# Patient Record
Sex: Female | Born: 1975 | Race: White | Hispanic: Yes | Marital: Single | Smoking: Never smoker
Health system: Southern US, Community
[De-identification: ages and names within clinical notes are randomized; demographics above are authoritative.]

## PROBLEM LIST (undated history)

## (undated) DIAGNOSIS — F32A Depression, unspecified: Secondary | ICD-10-CM

## (undated) DIAGNOSIS — F329 Major depressive disorder, single episode, unspecified: Secondary | ICD-10-CM

## (undated) DIAGNOSIS — K37 Unspecified appendicitis: Secondary | ICD-10-CM

---

## 2004-02-17 ENCOUNTER — Inpatient Hospital Stay (HOSPITAL_COMMUNITY): Admission: AD | Admit: 2004-02-17 | Discharge: 2004-02-20 | Payer: Self-pay | Admitting: Obstetrics

## 2005-08-11 ENCOUNTER — Emergency Department (HOSPITAL_COMMUNITY): Admission: EM | Admit: 2005-08-11 | Discharge: 2005-08-11 | Payer: Self-pay | Admitting: Emergency Medicine

## 2005-08-20 ENCOUNTER — Ambulatory Visit: Payer: Self-pay | Admitting: Family Medicine

## 2005-09-04 ENCOUNTER — Ambulatory Visit: Payer: Self-pay | Admitting: *Deleted

## 2006-03-23 ENCOUNTER — Encounter (INDEPENDENT_AMBULATORY_CARE_PROVIDER_SITE_OTHER): Payer: Self-pay | Admitting: *Deleted

## 2006-03-23 ENCOUNTER — Ambulatory Visit: Payer: Self-pay | Admitting: Family Medicine

## 2006-06-01 ENCOUNTER — Emergency Department (HOSPITAL_COMMUNITY): Admission: EM | Admit: 2006-06-01 | Discharge: 2006-06-01 | Payer: Self-pay | Admitting: Family Medicine

## 2006-10-13 ENCOUNTER — Ambulatory Visit: Payer: Self-pay | Admitting: Family Medicine

## 2006-10-13 ENCOUNTER — Encounter (INDEPENDENT_AMBULATORY_CARE_PROVIDER_SITE_OTHER): Payer: Self-pay | Admitting: Family Medicine

## 2006-11-11 ENCOUNTER — Emergency Department (HOSPITAL_COMMUNITY): Admission: EM | Admit: 2006-11-11 | Discharge: 2006-11-11 | Payer: Self-pay | Admitting: Family Medicine

## 2006-11-12 ENCOUNTER — Ambulatory Visit (HOSPITAL_COMMUNITY): Admission: RE | Admit: 2006-11-12 | Discharge: 2006-11-12 | Payer: Self-pay | Admitting: Emergency Medicine

## 2006-12-22 ENCOUNTER — Emergency Department (HOSPITAL_COMMUNITY): Admission: EM | Admit: 2006-12-22 | Discharge: 2006-12-22 | Payer: Self-pay | Admitting: Family Medicine

## 2008-01-26 ENCOUNTER — Ambulatory Visit: Payer: Self-pay | Admitting: Internal Medicine

## 2008-05-20 ENCOUNTER — Emergency Department (HOSPITAL_COMMUNITY): Admission: EM | Admit: 2008-05-20 | Discharge: 2008-05-20 | Payer: Self-pay | Admitting: Family Medicine

## 2008-12-17 ENCOUNTER — Ambulatory Visit: Payer: Self-pay | Admitting: Internal Medicine

## 2008-12-17 ENCOUNTER — Encounter (INDEPENDENT_AMBULATORY_CARE_PROVIDER_SITE_OTHER): Payer: Self-pay | Admitting: Family Medicine

## 2008-12-17 LAB — CONVERTED CEMR LAB
Basophils Absolute: 0 10*3/uL (ref 0.0–0.1)
Lymphocytes Relative: 37 % (ref 12–46)
Neutro Abs: 3.5 10*3/uL (ref 1.7–7.7)
Neutrophils Relative %: 53 % (ref 43–77)
Platelets: 309 10*3/uL (ref 150–400)
RDW: 12.5 % (ref 11.5–15.5)
TSH: 2.062 microintl units/mL (ref 0.350–4.500)

## 2009-01-15 ENCOUNTER — Emergency Department (HOSPITAL_COMMUNITY): Admission: EM | Admit: 2009-01-15 | Discharge: 2009-01-15 | Payer: Self-pay | Admitting: Family Medicine

## 2009-09-13 ENCOUNTER — Ambulatory Visit (HOSPITAL_COMMUNITY): Admission: RE | Admit: 2009-09-13 | Discharge: 2009-09-13 | Payer: Self-pay | Admitting: Obstetrics & Gynecology

## 2010-01-16 ENCOUNTER — Encounter: Payer: Self-pay | Admitting: Obstetrics

## 2010-01-16 ENCOUNTER — Inpatient Hospital Stay (HOSPITAL_COMMUNITY): Admission: AD | Admit: 2010-01-16 | Discharge: 2010-01-19 | Payer: Self-pay | Admitting: Obstetrics

## 2010-10-09 ENCOUNTER — Encounter: Payer: Self-pay | Admitting: Obstetrics & Gynecology

## 2010-10-09 ENCOUNTER — Ambulatory Visit: Admit: 2010-10-09 | Payer: Self-pay | Admitting: Obstetrics & Gynecology

## 2010-10-09 ENCOUNTER — Encounter: Payer: Self-pay | Admitting: Physician Assistant

## 2010-10-09 ENCOUNTER — Other Ambulatory Visit: Payer: Self-pay | Admitting: Obstetrics & Gynecology

## 2010-10-09 DIAGNOSIS — N949 Unspecified condition associated with female genital organs and menstrual cycle: Secondary | ICD-10-CM

## 2010-10-09 LAB — CONVERTED CEMR LAB: GC Probe Amp, Genital: NEGATIVE

## 2010-10-09 LAB — GLUCOSE, CAPILLARY: Glucose-Capillary: 90 mg/dL (ref 70–99)

## 2010-10-13 ENCOUNTER — Other Ambulatory Visit: Payer: Self-pay

## 2010-10-13 ENCOUNTER — Encounter (INDEPENDENT_AMBULATORY_CARE_PROVIDER_SITE_OTHER): Payer: Self-pay | Admitting: *Deleted

## 2010-10-13 DIAGNOSIS — Z0189 Encounter for other specified special examinations: Secondary | ICD-10-CM

## 2010-10-27 ENCOUNTER — Ambulatory Visit: Payer: Self-pay | Admitting: Physician Assistant

## 2010-10-27 DIAGNOSIS — N949 Unspecified condition associated with female genital organs and menstrual cycle: Secondary | ICD-10-CM

## 2010-11-25 LAB — CBC
HCT: 28.5 % — ABNORMAL LOW (ref 36.0–46.0)
Hemoglobin: 10.2 g/dL — ABNORMAL LOW (ref 12.0–15.0)
Hemoglobin: 12.8 g/dL (ref 12.0–15.0)
MCHC: 35.3 g/dL (ref 30.0–36.0)
MCV: 92.1 fL (ref 78.0–100.0)
MCV: 92.8 fL (ref 78.0–100.0)
Platelets: 185 10*3/uL (ref 150–400)
RBC: 3.92 MIL/uL (ref 3.87–5.11)
WBC: 11.9 10*3/uL — ABNORMAL HIGH (ref 4.0–10.5)

## 2010-11-28 NOTE — Progress Notes (Signed)
Pamela Travis, Pamela Travis    ACCOUNT NO.:  [id number]  MEDICAL RECORD NO.:  [id number]           PATIENT TYPE:  A  LOCATION:  [facility]                   FACILITY:  [facility]  PHYSICIAN:  Maylon Cos, CNM    DATE OF BIRTH:  25-Feb-1976  DATE OF SERVICE:  10/09/2010                                 CLINIC NOTE  The patient presents to [facility].  REASON FOR TODAY'S VISIT:  The patient was referred from [facility] with a diagnosis of amenorrhea.  HISTORY OF PRESENT ILLNESS:  The patient is 35 years old who states that gravida 2 para 2-0-0-2.  Her last child was born in May 2011 by cesarean section, and she had a ParaGard IUD placed for contraception at her 6- week postpartum visit.  The patient states that she has not had a period on her own since the birth of her child and her bleeding stopped after childbirth.  She was given a trial of Provera 5 mg a day for 5 days per Dr. Jaynie Collins at the end of December.  The patient does report that she had 2 periods in January, the first one on September 12, 2010, lasting 4 days and the second one being on September 26, 2010, also lasting 4 days.  She describes these periods are heavy in nature requiring her to change a regular pad 10 times a day.  She denies clotting with these.  MEDICAL HISTORY:  The patient has no known drug allergies.  Vaccines are up-to-date.  MENSTRUAL HISTORY:  She was 15 at menarche.  She has had irregular periods as described in HPI.  CONTRACEPTIVE HISTORY:  She has a ParaGard IUD.  OBSTETRICAL HISTORY:  She has been pregnant twice has 2 living children and has had 1 cesarean section.  Her last Pap smear was March 03, 2010 at [facility] surgery.  SURGICAL HISTORY:  Negative other than her cesarean section.  PHYSICAL EXAMINATION:  GENERAL:  On examination today, the patient is a pleasant Hispanic female who appears to be her stated age.  She is in no apparent distress. VITAL SIGNS:   Vital signs are stable.  Blood pressure is 105/67.  Her weight today is 154.8. HEENT:  Grossly normal with no thyromegaly. ABDOMEN:  Soft and nontender. PELVIC:  External genitalia without lesions.  Mucous membranes are pink, irregular rugae.  She has a scant amount of yellow creamy discharge without odor.  Cervix is parous and nonfriable.  Bimanual exam reveals nonenlarged, nontender uterus with nonenlarged, nontender adnexa. EXTREMITIES:  Equal strength and range of motion without signs of edema.  ASSESSMENT:  Dysfunctional uterine bleeding.  PLAN:  Discussed today with the patient that we will assess lab work today to look for possible reasons for her dysfunctional uterine bleeding with ParaGard IUD.  TSH, prolactin, and fasting 17 alpha- hydroxyprogesterone along with range of blood sugar were drawn.  The patient was given the option of starting oral contraceptive pills to regulate her cycles because she obviously can have a withdrawal bleed. She declines this at this time.  The patient should follow up in 2 weeks for the results of her lab work or p.r.n. with problems.  ______________________________ Maylon Cos, CNM    SS/MEDQ  D:  10/09/2010  T:  10/10/2010  Job:  [id number]

## 2010-11-28 NOTE — Progress Notes (Signed)
Pamela Travis, Pamela Travis    ACCOUNT NO.:  [id number]  MEDICAL RECORD NO.:  [id number]           PATIENT TYPE:  A  [facility]ATION[facility]                   FACILITY:  [facility]  PHYSICIAN:  Maylon Cos, CNM    DATE OF BIRTH:  10/16/1975  DATE OF SERVICE:  10/27/2010                                 CLINIC NOTE  The patient is being seen in the GYN Clinic at [facility].  REASON FOR TODAY VISIT:  Results of lab work.  HISTORY OF PRESENT ILLNESS:  The patient is a 35 year old gravida 2, para 2-0-0-2 who has a ParaGard IUD for contraception for approximately 8 months.  She presented originally at the beginning of this month on October 09, 2010 and saw myself concerning her complaint of not having had a period since the IUD had been placed.  She was given a trial of Provera at the end of December 2011, and then had 2 periods in January 2012. At the time of her visit, she had a normal exam and lab work was drawn to assess for other causes of her amenorrhea.  Options were explained to her at that time that if she is desiring a monthly periods and lab work is normal that it may be prudent for [place] to pull her ParaGard IUD and start her on oral contraceptive pills or some other daily dosing.  The following labs were explained to the patient through her interpreter.  Her alpha hydroxyprogesterone was normal at 66. Gonorrhea and Chlamydia were both negative.  TSH is normal at 1.265 and prolactin level was normal at 9.  These results were shared with the patient and reassurance was given concerning no seeming underlying pituitary or thyroid dysfunction that is causing her amenorrhea. Options again were explained to her for continue placement of IUD versus removing IUD and starting on oral contraceptive pills.  The patient states that she does desire to continue with a ParaGard in place and that she does not desire oral contraceptive pills and she is okay with not having a period as long  as lab work and exam were within normal limits.  I agree with this opinion.  ASSESSMENT:  Dysfunctional uterine bleeding, likely related to ParaGard intrauterine device.  PLAN:  The patient will keep ParaGard IUD in place.  She should follow up with [place] or with the [facility] Department for continued GYN care or with problems.          ______________________________ Maylon Cos, CNM    SS/MEDQ  D:  10/27/2010  T:  10/28/2010  Job:  [id number]

## 2010-12-16 LAB — POCT I-STAT, CHEM 8
Calcium, Ion: 1.2 mmol/L (ref 1.12–1.32)
Chloride: 102 mEq/L (ref 96–112)
Creatinine, Ser: 0.7 mg/dL (ref 0.4–1.2)
Glucose, Bld: 115 mg/dL — ABNORMAL HIGH (ref 70–99)
HCT: 39 % (ref 36.0–46.0)

## 2011-01-27 ENCOUNTER — Inpatient Hospital Stay (INDEPENDENT_AMBULATORY_CARE_PROVIDER_SITE_OTHER)
Admission: RE | Admit: 2011-01-27 | Discharge: 2011-01-27 | Disposition: A | Discharge status: Home / Self Care | Payer: Self-pay | Source: Ambulatory Visit | Attending: Emergency Medicine | Admitting: Emergency Medicine

## 2011-01-27 DIAGNOSIS — J069 Acute upper respiratory infection, unspecified: Secondary | ICD-10-CM

## 2011-06-10 LAB — POCT URINALYSIS DIP (DEVICE)
Bilirubin Urine: NEGATIVE
Hgb urine dipstick: NEGATIVE
Ketones, ur: NEGATIVE
Nitrite: NEGATIVE
Specific Gravity, Urine: 1.015
pH: 7

## 2012-12-18 ENCOUNTER — Emergency Department (INDEPENDENT_AMBULATORY_CARE_PROVIDER_SITE_OTHER)
Admission: EM | Admit: 2012-12-18 | Discharge: 2012-12-18 | Disposition: A | Discharge status: Home / Self Care | Payer: Self-pay | Source: Home / Self Care | Attending: Emergency Medicine | Admitting: Emergency Medicine

## 2012-12-18 ENCOUNTER — Encounter (HOSPITAL_COMMUNITY): Payer: Self-pay | Admitting: Emergency Medicine

## 2012-12-18 DIAGNOSIS — G43909 Migraine, unspecified, not intractable, without status migrainosus: Secondary | ICD-10-CM

## 2012-12-18 MED ORDER — ONDANSETRON HCL 4 MG PO TABS: 4.0000 mg | ORAL_TABLET | Freq: Three times a day (TID) | ORAL | Status: DC | PRN

## 2012-12-18 MED ORDER — ONDANSETRON 4 MG PO TBDP
4.0000 mg | ORAL_TABLET | Freq: Once | ORAL | Status: AC
  Administered 2012-12-18: 4 mg via ORAL

## 2012-12-18 MED ORDER — ACETAMINOPHEN-CODEINE #3 300-30 MG PO TABS: 1.0000 | ORAL_TABLET | Freq: Four times a day (QID) | ORAL | Status: DC | PRN

## 2012-12-18 MED ORDER — KETOROLAC TROMETHAMINE 60 MG/2ML IM SOLN
60.0000 mg | Freq: Once | INTRAMUSCULAR | Status: AC
  Administered 2012-12-18: 60 mg via INTRAMUSCULAR

## 2012-12-18 MED ORDER — ONDANSETRON 4 MG PO TBDP
ORAL_TABLET | ORAL | Status: AC
  Filled 2012-12-18: qty 1

## 2012-12-18 MED ORDER — ONDANSETRON HCL 4 MG PO TABS: 4.0000 mg | ORAL_TABLET | Freq: Four times a day (QID) | ORAL | Status: DC

## 2012-12-18 MED ORDER — KETOROLAC TROMETHAMINE 60 MG/2ML IM SOLN
INTRAMUSCULAR | Status: AC
  Filled 2012-12-18: qty 2

## 2012-12-18 NOTE — ED Notes (Signed)
Pt c/o migraine headache. Hx of migraines. Pt states that she has been under a lot stress lately.  Some nausea and vomiting. Denies blurred or spotty vision. Symptoms gradually getting worse.  Pt has taken ibuprofen with mild relief.

## 2012-12-18 NOTE — ED Provider Notes (Signed)
History     CSN: [id number]  Arrival date & time 12/18/12  1310   First MD Initiated Contact with Patient 12/18/12 1327      Chief Complaint  Patient presents with  . Migraine    hx of migraines. pt has had some n/v. pt states under alot of stress lately    (Consider location/radiation/quality/duration/timing/severity/associated sxs/prior treatment) HPI Comments: Headache,,,,as her usual migraines  been about 7 month since my last episode...started about 6 days ago...and and ist not getting better, today started with nausea and vomited x 1. No paresthesias, No weakness. No visual changes. Patient feels she's been under a lot of stress lately. Denies any further symptoms such as fevers, visual changes oral neurological symptoms such as paresthesias or weakness. She has attempted with over-the-counter Tylenol but it's not helping.  Patient is a 37 y.o. female presenting with migraines. The history is provided by the patient.  Migraine This is a recurrent problem. The current episode started more than 2 days ago. The problem occurs constantly. The problem has not changed since onset.Associated symptoms include headaches. Pertinent negatives include no chest pain, no abdominal pain and no shortness of breath. The symptoms are aggravated by stress and walking. Nothing relieves the symptoms. The treatment provided no relief.    History reviewed. No pertinent past medical history.  Past Surgical History  Procedure Laterality Date  . Cesarean section      History reviewed. No pertinent family history.  History  Substance Use Topics  . Smoking status: Never Smoker   . Smokeless tobacco: Not on file  . Alcohol Use: No    OB History   Grav Para Term Preterm Abortions TAB SAB Ect Mult Living                  Review of Systems  Constitutional: Positive for activity change. Negative for fever, appetite change and fatigue.  HENT: Negative for ear pain, nosebleeds, facial swelling,  neck pain, neck stiffness, postnasal drip and tinnitus.   Eyes: Positive for photophobia.  Respiratory: Negative for shortness of breath.   Cardiovascular: Negative for chest pain.  Gastrointestinal: Negative for abdominal pain and blood in stool.  Musculoskeletal: Negative for back pain, joint swelling, arthralgias and gait problem.  Skin: Negative for color change and pallor.  Neurological: Positive for headaches.    Allergies  Review of patient's allergies indicates no known allergies.  Home Medications   Current Outpatient Rx  Name  Route  Sig  Dispense  Refill  . FLUoxetine (PROZAC) 10 MG tablet   Oral   Take 10 mg by mouth daily.         Marland Kitchen acetaminophen-codeine (TYLENOL #3) 300-30 MG per tablet   Oral   Take 1-2 tablets by mouth every 6 (six) hours as needed for pain.   15 tablet   0   . acetaminophen-codeine (TYLENOL #3) 300-30 MG per tablet   Oral   Take 1-2 tablets by mouth every 6 (six) hours as needed for pain.   20 tablet   0   . ondansetron (ZOFRAN) 4 MG tablet   Oral   Take 1 tablet (4 mg total) by mouth every 8 (eight) hours as needed for nausea.   20 tablet   0   . ondansetron (ZOFRAN) 4 MG tablet   Oral   Take 1 tablet (4 mg total) by mouth every 6 (six) hours.   12 tablet   0     BP 117/70  Pulse  66  Temp(Src) 98 F (36.7 C) (Oral)  Resp 18  SpO2 99%  LMP 12/11/2012  Physical Exam  Nursing note and vitals reviewed. Constitutional: She is oriented to person, place, and time. She appears distressed.  Eyes: Pupils are equal, round, and reactive to light.  Neck: Normal range of motion. Neck supple. No JVD present.  Abdominal: Normal appearance. There is no tenderness.  Neurological: She is alert and oriented to person, place, and time. She has normal strength. She displays no tremor. No cranial nerve deficit or sensory deficit. She exhibits normal muscle tone. Coordination normal.  Skin: No rash noted. No erythema.    ED Course   Procedures (including critical care time)  Labs Reviewed - No data to display No results found.   1. Migraine headache       MDM  Migraine headache. Patient was responsive to a intramuscular to oral dull dose along with Zofran feeling much improved has been given a prescription of Tylenol #3 along with Zofran. She has been instructed about what symptoms should warrant further evaluation or her return. Patient agrees with current treatment plan followup care as necessary.       Jimmie Molly, MD 12/18/12 [phone]

## 2014-11-21 ENCOUNTER — Emergency Department (HOSPITAL_COMMUNITY)
Admission: EM | Admit: 2014-11-21 | Discharge: 2014-11-21 | Disposition: A | Discharge status: Other Acute Inpatient Hospital | Payer: Self-pay

## 2014-11-21 ENCOUNTER — Ambulatory Visit (HOSPITAL_COMMUNITY)
Admission: RE | Admit: 2014-11-21 | Discharge: 2014-11-21 | Disposition: A | Discharge status: Home / Self Care | Payer: Medicaid Other | Source: Ambulatory Visit | Attending: Family Medicine | Admitting: Family Medicine

## 2014-11-21 ENCOUNTER — Observation Stay (HOSPITAL_COMMUNITY)
Admission: EM | Admit: 2014-11-21 | Discharge: 2014-11-22 | Disposition: A | Discharge status: Home / Self Care | Payer: Medicaid Other | Attending: Surgery | Admitting: Surgery

## 2014-11-21 ENCOUNTER — Encounter (HOSPITAL_COMMUNITY): Payer: Self-pay

## 2014-11-21 ENCOUNTER — Ambulatory Visit (INDEPENDENT_AMBULATORY_CARE_PROVIDER_SITE_OTHER): Payer: Self-pay | Admitting: Family Medicine

## 2014-11-21 VITALS — BP 106/68 | HR 79 | Temp 98.2°F | Resp 18 | Ht 65.0 in | Wt 175.0 lb

## 2014-11-21 DIAGNOSIS — K3580 Unspecified acute appendicitis: Secondary | ICD-10-CM

## 2014-11-21 DIAGNOSIS — Z79899 Other long term (current) drug therapy: Secondary | ICD-10-CM | POA: Diagnosis not present

## 2014-11-21 DIAGNOSIS — R109 Unspecified abdominal pain: Secondary | ICD-10-CM

## 2014-11-21 DIAGNOSIS — K37 Unspecified appendicitis: Secondary | ICD-10-CM | POA: Diagnosis present

## 2014-11-21 DIAGNOSIS — R197 Diarrhea, unspecified: Secondary | ICD-10-CM | POA: Insufficient documentation

## 2014-11-21 HISTORY — DX: Depression, unspecified: F32.A

## 2014-11-21 HISTORY — DX: Unspecified appendicitis: K37

## 2014-11-21 HISTORY — DX: Major depressive disorder, single episode, unspecified: F32.9

## 2014-11-21 LAB — POCT UA - MICROSCOPIC ONLY
Casts, Ur, LPF, POC: NEGATIVE
Crystals, Ur, HPF, POC: NEGATIVE
Epithelial cells, urine per micros: NEGATIVE
Mucus, UA: NEGATIVE
WBC, Ur, HPF, POC: NEGATIVE
Yeast, UA: NEGATIVE

## 2014-11-21 LAB — CBC WITH DIFFERENTIAL/PLATELET
BASOS ABS: 0 10*3/uL (ref 0.0–0.1)
BASOS PCT: 0 % (ref 0–1)
EOS PCT: 2 % (ref 0–5)
Eosinophils Absolute: 0.2 10*3/uL (ref 0.0–0.7)
HCT: 32.8 % — ABNORMAL LOW (ref 36.0–46.0)
Hemoglobin: 11.4 g/dL — ABNORMAL LOW (ref 12.0–15.0)
LYMPHS ABS: 2.7 10*3/uL (ref 0.7–4.0)
Lymphocytes Relative: 26 % (ref 12–46)
MCH: 29.8 pg (ref 26.0–34.0)
MCHC: 34.8 g/dL (ref 30.0–36.0)
MCV: 85.6 fL (ref 78.0–100.0)
Monocytes Absolute: 0.7 10*3/uL (ref 0.1–1.0)
Monocytes Relative: 7 % (ref 3–12)
NEUTROS PCT: 65 % (ref 43–77)
Neutro Abs: 6.6 10*3/uL (ref 1.7–7.7)
PLATELETS: 311 10*3/uL (ref 150–400)
RBC: 3.83 MIL/uL — AB (ref 3.87–5.11)
RDW: 12.6 % (ref 11.5–15.5)
WBC: 10.1 10*3/uL (ref 4.0–10.5)

## 2014-11-21 LAB — POCT URINALYSIS DIPSTICK
BILIRUBIN UA: NEGATIVE
GLUCOSE UA: NEGATIVE
KETONES UA: NEGATIVE
Leukocytes, UA: NEGATIVE
NITRITE UA: NEGATIVE
PROTEIN UA: NEGATIVE
Spec Grav, UA: 1.01
Urobilinogen, UA: 0.2
pH, UA: 6.5

## 2014-11-21 LAB — POCT CBC
Granulocyte percent: 77.1 %G (ref 37–80)
HEMATOCRIT: 37.5 % — AB (ref 37.7–47.9)
Hemoglobin: 12 g/dL — AB (ref 12.2–16.2)
LYMPH, POC: 2.5 (ref 0.6–3.4)
MCH: 28.3 pg (ref 27–31.2)
MCHC: 32 g/dL (ref 31.8–35.4)
MCV: 88.4 fL (ref 80–97)
MID (CBC): 0.6 (ref 0–0.9)
MPV: 6.2 fL (ref 0–99.8)
PLATELET COUNT, POC: 385 10*3/uL (ref 142–424)
POC Granulocyte: 10.3 — AB (ref 2–6.9)
POC LYMPH %: 18.5 % (ref 10–50)
POC MID %: 4.4 %M (ref 0–12)
RBC: 4.24 M/uL (ref 4.04–5.48)
RDW, POC: 12.7 %
WBC: 13.4 10*3/uL — AB (ref 4.6–10.2)

## 2014-11-21 LAB — POCT URINE PREGNANCY: Preg Test, Ur: NEGATIVE

## 2014-11-21 MED ORDER — IOHEXOL 300 MG/ML  SOLN
100.0000 mL | Freq: Once | INTRAMUSCULAR | Status: AC | PRN
  Administered 2014-11-21: 100 mL via INTRAVENOUS

## 2014-11-21 MED ORDER — ONDANSETRON HCL 4 MG/2ML IJ SOLN
4.0000 mg | Freq: Once | INTRAMUSCULAR | Status: AC
  Administered 2014-11-21: 4 mg via INTRAVENOUS
  Filled 2014-11-21: qty 2

## 2014-11-21 MED ORDER — SODIUM CHLORIDE 0.9 % IV BOLUS (SEPSIS)
250.0000 mL | Freq: Once | INTRAVENOUS | Status: AC
  Administered 2014-11-21: 250 mL via INTRAVENOUS

## 2014-11-21 MED ORDER — HYDROMORPHONE HCL 1 MG/ML IJ SOLN
0.5000 mg | Freq: Once | INTRAMUSCULAR | Status: AC
  Administered 2014-11-21: 0.5 mg via INTRAVENOUS
  Filled 2014-11-21: qty 1

## 2014-11-21 NOTE — ED Provider Notes (Signed)
CSN: [id number]     Arrival date & time 11/21/14  2216 History   First MD Initiated Contact with Patient 11/21/14 2219     Chief Complaint  Patient presents with  . Abdominal Pain     (Consider location/radiation/quality/duration/timing/severity/associated sxs/prior Treatment) Patient is a 39 y.o. female presenting with abdominal pain. The history is provided by the patient. No language interpreter was used.  Abdominal Pain Pain location:  RLQ Pain quality: aching   Pain radiates to:  Does not radiate Pain severity:  Moderate Duration:  2 days Timing:  Constant Progression:  Worsening Chronicity:  New Context: not alcohol use, not medication withdrawal, not previous surgeries and not sick contacts   Relieved by:  Nothing Worsened by:  Nothing tried Ineffective treatments:  None tried Associated symptoms: no anorexia and no vomiting   Risk factors: has not had multiple surgeries   Pt was seen at urgent care and sent here for outpatient ct scan.   Pt reports she last ate at 5p.   History reviewed. No pertinent past medical history. Past Surgical History  Procedure Laterality Date  . Cesarean section     No family history on file. History  Substance Use Topics  . Smoking status: Never Smoker   . Smokeless tobacco: Not on file  . Alcohol Use: No   OB History    No data available     Review of Systems  Gastrointestinal: Positive for abdominal pain. Negative for vomiting and anorexia.  All other systems reviewed and are negative.     Allergies  Review of patient's allergies indicates no known allergies.  Home Medications   Prior to Admission medications   Medication Sig Start Date End Date Taking? Authorizing Provider  FLUoxetine (PROZAC) 10 MG tablet Take 10 mg by mouth daily.    Historical Provider, MD  ibuprofen (ADVIL,MOTRIN) 800 MG tablet Take 800 mg by mouth every 8 (eight) hours as needed.    Historical Provider, MD   BP 114/69 mmHg  Pulse 67  Temp(Src)  98.5 F (36.9 C) (Oral)  Resp 16  Ht 5\' 5"  (1.651 m)  Wt 175 lb (79.379 kg)  BMI 29.12 kg/m2  SpO2 98%  LMP 11/18/2014 Physical Exam  Constitutional: She is oriented to person, place, and time. She appears well-developed and well-nourished.  HENT:  Head: Normocephalic.  Eyes: Conjunctivae and EOM are normal. Pupils are equal, round, and reactive to light.  Neck: Normal range of motion.  Pulmonary/Chest: Effort normal.  Abdominal: Soft. She exhibits no distension and no mass. There is tenderness. There is no rebound.  Musculoskeletal: Normal range of motion.  Neurological: She is alert and oriented to person, place, and time.  Skin: Skin is warm.  Psychiatric: She has a normal mood and affect.  Nursing note and vitals reviewed.   ED Course  Procedures (including critical care time) Labs Review Labs Reviewed - No data to display  Imaging Review Ct Abdomen Pelvis W Contrast  11/21/2014   CLINICAL DATA:  Right lower quadrant abdominal pain for 2 days. Diarrhea. Initial encounter.  EXAM: CT ABDOMEN AND PELVIS WITH CONTRAST  TECHNIQUE: Multidetector CT imaging of the abdomen and pelvis was performed using the standard protocol following bolus administration of intravenous contrast.  CONTRAST:  100mL OMNIPAQUE IOHEXOL 300 MG/ML  SOLN  COMPARISON:  Pelvic ultrasound performed 09/13/2009  FINDINGS: The visualized lung bases are clear.  The liver and spleen are unremarkable in appearance. The gallbladder is decompressed and within normal limits. The pancreas  and adrenal glands are unremarkable.  The kidneys are unremarkable in appearance. There is no evidence of hydronephrosis. No renal or ureteral stones are seen. No perinephric stranding is appreciated.  No free fluid is identified. The small bowel is unremarkable in appearance. The stomach is within normal limits. No acute vascular abnormalities are seen.  The appendix measures up to 1.1 cm in diameter, with mild wall thickening and  surrounding soft tissue inflammation, compatible with acute appendicitis. The appendix is predominantly inferior to the cecum. There is no evidence of perforation or abscess formation. Trace free fluid is seen. Mildly prominent pericecal nodes are noted.  Contrast progresses to the level of the ascending colon. The colon is unremarkable in appearance.  The bladder is mildly distended and grossly unremarkable. The uterus is within normal limits. The ovaries are relatively symmetric. No suspicious adnexal masses are seen. No inguinal lymphadenopathy is seen.  No acute osseous abnormalities are identified.  IMPRESSION: Acute appendicitis noted, with dilatation of the appendix to 1.1 cm in diameter, mild wall thickening and surrounding soft tissue inflammation. Trace free fluid noted. Mildly prominent pericecal nodes seen. No evidence of perforation or abscess formation at this time.  These results were relayed by telephone at the time of interpretation on 11/21/2014 at 9:59 pm to Dr. Abbe Amsterdam , who verbally acknowledged these results.   Electronically Signed   By: Roanna Raider M.D.   On: 11/21/2014 22:01     EKG Interpretation None      MDM  Pt brought to ED with Ct that shows appendicitis.  Labs ordered,  IV dilaudid .  and zofran for nausea.    Final diagnoses:  Appendicitis, unspecified appendicitis type    I spoke to Dr. Lindie Spruce.  Pt given rocephin flagyl iv.   Pt npo. Pt to be admitted    Elson Areas, PA-C 11/21/14 [address] [place], PA-C 11/22/14 1610  Mirian Mo, MD 11/27/14 [phone]

## 2014-11-21 NOTE — Progress Notes (Addendum)
[facility] [address], [place] [address] [phone]  Date:  11/21/2014   Name:  Pamela Travis   DOB:  Aug 26, 1976   MRN:  [id number]  PCP:  No PCP Per Patient    Chief Complaint: Flank Pain; Abdominal Pain; and Gastrophageal Reflux   History of Present Illness:  Pamela Travis is a 39 y.o. very pleasant female patient who presents with the following:  Here today with complaint of pain in her epigastric area and into her right lower quadrant.  She has noted this since yesterday am.  She thought this might be due to her menses, but it is worse than usual menstrual cramps and has not gone away.  Did not resolve with ibuprofen.   A few days ago she noted vaginal discharge which appeared green. This has cleared up and she endorses a history of vaginal discharge She feels nauseated when she presses on her belly She has not vomited. She does not have much appetite but did eat oatmeal and a salad this am  She is not having any urinary sx such as dysuria or urinary frequency.   She has her menses right now  She has had a C/s but no other operation on her belly.  She is generally in good health   There are no active problems to display for this patient.   History reviewed. No pertinent past medical history.  Past Surgical History  Procedure Laterality Date  . Cesarean section      History  Substance Use Topics  . Smoking status: Never Smoker   . Smokeless tobacco: Not on file  . Alcohol Use: No    History reviewed. No pertinent family history.  No Known Allergies  Medication list has been reviewed and updated.  Current Outpatient Prescriptions on File Prior to Visit  Medication Sig Dispense Refill  . acetaminophen-codeine (TYLENOL #3) 300-30 MG per tablet Take 1-2 tablets by mouth every 6 (six) hours as needed for pain. (Patient not taking: Reported on 11/21/2014) 15 tablet 0  . acetaminophen-codeine (TYLENOL #3) 300-30 MG  per tablet Take 1-2 tablets by mouth every 6 (six) hours as needed for pain. (Patient not taking: Reported on 11/21/2014) 20 tablet 0  . FLUoxetine (PROZAC) 10 MG tablet Take 10 mg by mouth daily.    . ondansetron (ZOFRAN) 4 MG tablet Take 1 tablet (4 mg total) by mouth every 8 (eight) hours as needed for nausea. (Patient not taking: Reported on 11/21/2014) 20 tablet 0  . ondansetron (ZOFRAN) 4 MG tablet Take 1 tablet (4 mg total) by mouth every 6 (six) hours. (Patient not taking: Reported on 11/21/2014) 12 tablet 0   No current facility-administered medications on file prior to visit.    Review of Systems:  As per HPI- otherwise negative.   Physical Examination: Filed Vitals:   11/21/14 1540  BP: 106/68  Pulse: 79  Temp: 98.2 F (36.8 C)  Resp: 18   Filed Vitals:   11/21/14 1540  Height:  (1.651 m)  Weight: 175 lb (79.379 kg)   Body mass index is 29.12 kg/(m^2). Ideal Body Weight: Weight in (lb) to have BMI = 25: 149.9  GEN: WDWN, NAD, Non-toxic, A & O x 3, overweight, looks well HEENT: Atraumatic, Normocephalic. Neck supple. No masses, No LAD.  Bilateral TM wnl, oropharynx normal.  PEERL,EOMI.   Ears and Nose: No external deformity. CV: RRR, No M/G/R. No JVD. No thrill. No extra heart sounds. PULM:  CTA B, no wheezes, crackles, rhonchi. No retractions. No resp. distress. No accessory muscle use. ABD: S,  ND, +BS. No HSM.  Tenderness over RLQ, rebound and guarding as well EXTR: No c/c/e NEURO Normal gait.  PSYCH: Normally interactive. Conversant. Not depressed or anxious appearing.  Calm demeanor.  Gu: she has menstural bleeding, no CMT, no adnexal tenderness or masses.  No vaginal discharge noted, no lesions  Pt is menstruating   Results for orders placed or performed in visit on 11/21/14  POCT urinalysis dipstick  Result Value Ref Range   Color, UA yellow    Clarity, UA clear    Glucose, UA neg    Bilirubin, UA neg    Ketones, UA neg    Spec Grav, UA 1.010     Blood, UA mod    pH, UA 6.5    Protein, UA neg    Urobilinogen, UA 0.2    Nitrite, UA neg    Leukocytes, UA Negative   POCT UA - Microscopic Only  Result Value Ref Range   WBC, Ur, HPF, POC neg    RBC, urine, microscopic 12-18    Bacteria, U Microscopic trace    Mucus, UA neg    Epithelial cells, urine per micros neg    Crystals, Ur, HPF, POC neg    Casts, Ur, LPF, POC neg    Yeast, UA neg   POCT CBC  Result Value Ref Range   WBC 13.4 (A) 4.6 - 10.2 K/uL   Lymph, poc 2.5 0.6 - 3.4   POC LYMPH PERCENT 18.5 10 - 50 %L   MID (cbc) 0.6 0 - 0.9   POC MID % 4.4 0 - 12 %M   POC Granulocyte 10.3 (A) 2 - 6.9   Granulocyte percent 77.1 37 - 80 %G   RBC 4.24 4.04 - 5.48 M/uL   Hemoglobin 12.0 (A) 12.2 - 16.2 g/dL   HCT, POC 16.137.5 (A) 09.637.7 - 47.9 %   MCV 88.4 80 - 97 fL   MCH, POC 28.3 27 - 31.2 pg   MCHC 32.0 31.8 - 35.4 g/dL   RDW, POC 04.512.7 %   Platelet Count, POC 385 142 - 424 K/uL   MPV 6.2 0 - 99.8 fL  POCT urine pregnancy  Result Value Ref Range   Preg Test, Ur Negative      Assessment and Plan: Right sided abdominal pain - Plan: POCT urinalysis dipstick, POCT UA - Microscopic Only, POCT CBC, POCT urine pregnancy, GC/Chlamydia Probe Amp, CT Abdomen Pelvis W Contrast  Concern for appendicitis. Referral to hospital for CT scan now.  She is upset but understands.  I will speak to her about her CT results asap   Signed Abbe AmsterdamJessica Copland, MD   IMPRESSION: Acute appendicitis noted, with dilatation of the appendix to 1.1 cm in diameter, mild wall thickening and surrounding soft tissue inflammation. Trace free fluid noted. Mildly prominent pericecal nodes seen. No evidence of perforation or abscess formation at this time.  These results were relayed by telephone at the time of interpretation on 11/21/2014 at 9:59 pm to Dr. Abbe AmsterdamJESSICA COPLAND , who verbally acknowledged these results.  [facility]  She does have appendicitis.  Asked tech to  escort her to the ER, called and alerted charge nurse and EDP.  Also spoke with pt on her cell phone, explained that she needs to go to the ER to be checked in and receive surgery for her appendicitis.  She states understanding

## 2014-11-21 NOTE — Patient Instructions (Signed)
[facility] department. You will drink some contrast and then have your CT scan. Stay there until we talk on the phone!

## 2014-11-21 NOTE — ED Notes (Signed)
Pt reports RLQ abdominal pain for two days. She [facility], had a CT scan and it showed an appendicitis and was instructed to come here to be seen to have appendectomy.

## 2014-11-22 ENCOUNTER — Inpatient Hospital Stay (HOSPITAL_COMMUNITY): Payer: Medicaid Other | Admitting: Anesthesiology

## 2014-11-22 ENCOUNTER — Encounter (HOSPITAL_COMMUNITY): Admission: EM | Disposition: A | Discharge status: Home / Self Care | Payer: Self-pay | Source: Home / Self Care

## 2014-11-22 ENCOUNTER — Encounter (HOSPITAL_COMMUNITY): Payer: Self-pay | Admitting: Anesthesiology

## 2014-11-22 DIAGNOSIS — K3580 Unspecified acute appendicitis: Secondary | ICD-10-CM | POA: Diagnosis not present

## 2014-11-22 DIAGNOSIS — Z79899 Other long term (current) drug therapy: Secondary | ICD-10-CM | POA: Diagnosis not present

## 2014-11-22 DIAGNOSIS — K37 Unspecified appendicitis: Secondary | ICD-10-CM | POA: Diagnosis present

## 2014-11-22 HISTORY — PX: LAPAROSCOPIC APPENDECTOMY: SHX408

## 2014-11-22 HISTORY — PX: APPENDECTOMY: SHX54

## 2014-11-22 LAB — COMPREHENSIVE METABOLIC PANEL
ALBUMIN: 3.3 g/dL — AB (ref 3.5–5.2)
ALT: 15 U/L (ref 0–35)
AST: 16 U/L (ref 0–37)
Alkaline Phosphatase: 89 U/L (ref 39–117)
Anion gap: 10 (ref 5–15)
BUN: 7 mg/dL (ref 6–23)
CO2: 25 mmol/L (ref 19–32)
Calcium: 8.6 mg/dL (ref 8.4–10.5)
Chloride: 102 mmol/L (ref 96–112)
Creatinine, Ser: 0.71 mg/dL (ref 0.50–1.10)
GFR calc Af Amer: >90 mL/min (ref >90)
GFR calc non Af Amer: >90 mL/min (ref >90)
GLUCOSE: 108 mg/dL — AB (ref 70–99)
Potassium: 3.5 mmol/L (ref 3.5–5.1)
Sodium: 137 mmol/L (ref 135–145)
Total Bilirubin: 0.3 mg/dL (ref 0.3–1.2)
Total Protein: 6.6 g/dL (ref 6.0–8.3)

## 2014-11-22 LAB — GC/CHLAMYDIA PROBE AMP
CT PROBE, AMP APTIMA: NEGATIVE
GC Probe RNA: NEGATIVE

## 2014-11-22 SURGERY — APPENDECTOMY, LAPAROSCOPIC
Anesthesia: General | Site: Abdomen

## 2014-11-22 MED ORDER — PROPOFOL 10 MG/ML IV BOLUS
INTRAVENOUS | Status: AC
  Filled 2014-11-22: qty 20

## 2014-11-22 MED ORDER — ONDANSETRON HCL 4 MG PO TABS: 4.0000 mg | ORAL_TABLET | Freq: Four times a day (QID) | ORAL | Status: DC | PRN

## 2014-11-22 MED ORDER — ONDANSETRON HCL 4 MG/2ML IJ SOLN
INTRAMUSCULAR | Status: DC | PRN
  Administered 2014-11-22: 4 mg via INTRAVENOUS

## 2014-11-22 MED ORDER — METRONIDAZOLE IN NACL 5-0.79 MG/ML-% IV SOLN: 500.0000 mg | Freq: Once | INTRAVENOUS | Status: DC

## 2014-11-22 MED ORDER — ENOXAPARIN SODIUM 40 MG/0.4ML ~~LOC~~ SOLN: 40.0000 mg | SUBCUTANEOUS | Status: DC

## 2014-11-22 MED ORDER — POTASSIUM CHLORIDE IN NACL 20-0.9 MEQ/L-% IV SOLN
INTRAVENOUS | Status: DC
  Administered 2014-11-22: 10:00:00 via INTRAVENOUS
  Filled 2014-11-22 (×2): qty 1000

## 2014-11-22 MED ORDER — GLYCOPYRROLATE 0.2 MG/ML IJ SOLN
INTRAMUSCULAR | Status: DC | PRN
  Administered 2014-11-22: 0.2 mg via INTRAVENOUS

## 2014-11-22 MED ORDER — HYDROMORPHONE HCL 1 MG/ML IJ SOLN
0.5000 mg | INTRAMUSCULAR | Status: DC | PRN
  Administered 2014-11-22: 0.5 mg via INTRAVENOUS
  Filled 2014-11-22: qty 1

## 2014-11-22 MED ORDER — MIDAZOLAM HCL 5 MG/5ML IJ SOLN
INTRAMUSCULAR | Status: DC | PRN
  Administered 2014-11-22: 2 mg via INTRAVENOUS

## 2014-11-22 MED ORDER — MEPERIDINE HCL 25 MG/ML IJ SOLN: 6.2500 mg | INTRAMUSCULAR | Status: DC | PRN

## 2014-11-22 MED ORDER — ONDANSETRON HCL 4 MG/2ML IJ SOLN: 4.0000 mg | Freq: Four times a day (QID) | INTRAMUSCULAR | Status: DC | PRN

## 2014-11-22 MED ORDER — ONDANSETRON HCL 4 MG/2ML IJ SOLN
INTRAMUSCULAR | Status: AC
  Filled 2014-11-22: qty 2

## 2014-11-22 MED ORDER — KETOROLAC TROMETHAMINE 30 MG/ML IJ SOLN
INTRAMUSCULAR | Status: DC | PRN
  Administered 2014-11-22: 30 mg via INTRAVENOUS

## 2014-11-22 MED ORDER — LIDOCAINE HCL (CARDIAC) 20 MG/ML IV SOLN
INTRAVENOUS | Status: DC | PRN
  Administered 2014-11-22: 80 mg via INTRAVENOUS

## 2014-11-22 MED ORDER — DEXTROSE 5 % IV SOLN
2.0000 g | Freq: Once | INTRAVENOUS | Status: AC
  Administered 2014-11-22: 2 g via INTRAVENOUS
  Filled 2014-11-22: qty 2

## 2014-11-22 MED ORDER — HYDROMORPHONE HCL 1 MG/ML IJ SOLN: 0.2500 mg | INTRAMUSCULAR | Status: DC | PRN

## 2014-11-22 MED ORDER — DEXTROSE 5 % IV SOLN: 1.0000 g | Freq: Once | INTRAVENOUS | Status: DC

## 2014-11-22 MED ORDER — DEXTROSE 5 % IV SOLN: 2.0000 g | INTRAVENOUS | Status: DC

## 2014-11-22 MED ORDER — SUCCINYLCHOLINE CHLORIDE 20 MG/ML IJ SOLN
INTRAMUSCULAR | Status: DC | PRN
  Administered 2014-11-22: 90 mg via INTRAVENOUS

## 2014-11-22 MED ORDER — DEXAMETHASONE SODIUM PHOSPHATE 4 MG/ML IJ SOLN
INTRAMUSCULAR | Status: DC | PRN
  Administered 2014-11-22: 8 mg via INTRAVENOUS

## 2014-11-22 MED ORDER — 0.9 % SODIUM CHLORIDE (POUR BTL) OPTIME
TOPICAL | Status: DC | PRN
  Administered 2014-11-22: 1000 mL

## 2014-11-22 MED ORDER — LIDOCAINE HCL 4 % MT SOLN
OROMUCOSAL | Status: DC | PRN
  Administered 2014-11-22: 4 mL via TOPICAL

## 2014-11-22 MED ORDER — FENTANYL CITRATE 0.05 MG/ML IJ SOLN
INTRAMUSCULAR | Status: DC | PRN
  Administered 2014-11-22: 25 ug via INTRAVENOUS
  Administered 2014-11-22: 75 ug via INTRAVENOUS
  Administered 2014-11-22: 25 ug via INTRAVENOUS

## 2014-11-22 MED ORDER — DEXAMETHASONE SODIUM PHOSPHATE 4 MG/ML IJ SOLN
INTRAMUSCULAR | Status: AC
  Filled 2014-11-22: qty 2

## 2014-11-22 MED ORDER — SODIUM CHLORIDE 0.9 % IR SOLN
Status: DC | PRN
  Administered 2014-11-22: 1000 mL

## 2014-11-22 MED ORDER — INFLUENZA VAC SPLIT QUAD 0.5 ML IM SUSY: 0.5000 mL | PREFILLED_SYRINGE | INTRAMUSCULAR | Status: DC

## 2014-11-22 MED ORDER — DEXTROSE 5 % IV SOLN
1.0000 g | Freq: Three times a day (TID) | INTRAVENOUS | Status: DC
  Filled 2014-11-22 (×3): qty 1

## 2014-11-22 MED ORDER — ONDANSETRON HCL 4 MG/2ML IJ SOLN: 4.0000 mg | Freq: Once | INTRAMUSCULAR | Status: DC | PRN

## 2014-11-22 MED ORDER — FENTANYL CITRATE 0.05 MG/ML IJ SOLN
INTRAMUSCULAR | Status: AC
  Filled 2014-11-22: qty 5

## 2014-11-22 MED ORDER — BUPIVACAINE-EPINEPHRINE (PF) 0.25% -1:200000 IJ SOLN
INTRAMUSCULAR | Status: DC | PRN
  Administered 2014-11-22: 20 mL via PERINEURAL

## 2014-11-22 MED ORDER — MORPHINE SULFATE 2 MG/ML IJ SOLN: 1.0000 mg | INTRAMUSCULAR | Status: DC | PRN

## 2014-11-22 MED ORDER — EPHEDRINE SULFATE 50 MG/ML IJ SOLN
INTRAMUSCULAR | Status: DC | PRN
  Administered 2014-11-22: 10 mg via INTRAVENOUS

## 2014-11-22 MED ORDER — MIDAZOLAM HCL 2 MG/2ML IJ SOLN
INTRAMUSCULAR | Status: AC
  Filled 2014-11-22: qty 2

## 2014-11-22 MED ORDER — HYDROCODONE-ACETAMINOPHEN 5-325 MG PO TABS
1.0000 | ORAL_TABLET | ORAL | Status: DC | PRN
  Administered 2014-11-22 (×2): 1 via ORAL
  Filled 2014-11-22 (×2): qty 1

## 2014-11-22 MED ORDER — ROCURONIUM BROMIDE 50 MG/5ML IV SOLN
INTRAVENOUS | Status: AC
  Filled 2014-11-22: qty 1

## 2014-11-22 MED ORDER — LIDOCAINE HCL (CARDIAC) 20 MG/ML IV SOLN
INTRAVENOUS | Status: AC
  Filled 2014-11-22: qty 5

## 2014-11-22 MED ORDER — LACTATED RINGERS IV SOLN
INTRAVENOUS | Status: DC | PRN
  Administered 2014-11-22: 07:00:00 via INTRAVENOUS

## 2014-11-22 MED ORDER — HYDROCODONE-ACETAMINOPHEN 5-325 MG PO TABS: 1.0000 | ORAL_TABLET | Freq: Four times a day (QID) | ORAL | Status: DC | PRN

## 2014-11-22 MED ORDER — PROPOFOL 10 MG/ML IV BOLUS
INTRAVENOUS | Status: DC | PRN
  Administered 2014-11-22: 150 mg via INTRAVENOUS

## 2014-11-22 MED ORDER — BUPIVACAINE-EPINEPHRINE (PF) 0.25% -1:200000 IJ SOLN
INTRAMUSCULAR | Status: AC
  Filled 2014-11-22: qty 30

## 2014-11-22 MED ORDER — IBUPROFEN 600 MG PO TABS: 600.0000 mg | ORAL_TABLET | Freq: Four times a day (QID) | ORAL | Status: DC | PRN

## 2014-11-22 SURGICAL SUPPLY — 38 items
APPLIER CLIP 5 13 M/L LIGAMAX5 (MISCELLANEOUS)
APPLIER CLIP ROT 10 11.4 M/L (STAPLE)
CANISTER SUCTION 2500CC (MISCELLANEOUS) ×3 IMPLANT
CHLORAPREP W/TINT 26ML (MISCELLANEOUS) ×3 IMPLANT
CLIP APPLIE 5 13 M/L LIGAMAX5 (MISCELLANEOUS) IMPLANT
CLIP APPLIE ROT 10 11.4 M/L (STAPLE) IMPLANT
COVER SURGICAL LIGHT HANDLE (MISCELLANEOUS) ×3 IMPLANT
CUTTER LINEAR ENDO 35 ART FLEX (STAPLE) ×3 IMPLANT
CUTTER LINEAR ENDO 35 ETS (STAPLE) IMPLANT
DRAPE LAPAROSCOPIC ABDOMINAL (DRAPES) ×3 IMPLANT
ELECT REM PT RETURN 9FT ADLT (ELECTROSURGICAL) ×3
ELECTRODE REM PT RTRN 9FT ADLT (ELECTROSURGICAL) ×1 IMPLANT
ENDOLOOP SUT PDS II  0 18 (SUTURE)
ENDOLOOP SUT PDS II 0 18 (SUTURE) IMPLANT
GLOVE SURG SIGNA 7.5 PF LTX (GLOVE) ×3 IMPLANT
GOWN STRL REUS W/ TWL LRG LVL3 (GOWN DISPOSABLE) ×2 IMPLANT
GOWN STRL REUS W/ TWL XL LVL3 (GOWN DISPOSABLE) ×1 IMPLANT
GOWN STRL REUS W/TWL LRG LVL3 (GOWN DISPOSABLE) ×4
GOWN STRL REUS W/TWL XL LVL3 (GOWN DISPOSABLE) ×2
KIT BASIN OR (CUSTOM PROCEDURE TRAY) ×3 IMPLANT
KIT ROOM TURNOVER OR (KITS) ×3 IMPLANT
LIQUID BAND (GAUZE/BANDAGES/DRESSINGS) ×3 IMPLANT
NS IRRIG 1000ML POUR BTL (IV SOLUTION) ×3 IMPLANT
PAD ARMBOARD 7.5X6 YLW CONV (MISCELLANEOUS) ×6 IMPLANT
POUCH SPECIMEN RETRIEVAL 10MM (ENDOMECHANICALS) ×3 IMPLANT
RELOAD /EVU35 (ENDOMECHANICALS) IMPLANT
RELOAD CUTTER ETS 35MM STAND (ENDOMECHANICALS) IMPLANT
SCALPEL HARMONIC ACE (MISCELLANEOUS) ×3 IMPLANT
SET IRRIG TUBING LAPAROSCOPIC (IRRIGATION / IRRIGATOR) ×3 IMPLANT
SLEEVE ENDOPATH XCEL 5M (ENDOMECHANICALS) ×3 IMPLANT
SPECIMEN JAR SMALL (MISCELLANEOUS) ×3 IMPLANT
SUT MON AB 4-0 PC3 18 (SUTURE) ×3 IMPLANT
TOWEL OR 17X24 6PK STRL BLUE (TOWEL DISPOSABLE) IMPLANT
TOWEL OR 17X26 10 PK STRL BLUE (TOWEL DISPOSABLE) ×3 IMPLANT
TRAY LAPAROSCOPIC (CUSTOM PROCEDURE TRAY) ×3 IMPLANT
TROCAR XCEL BLUNT TIP 100MML (ENDOMECHANICALS) ×3 IMPLANT
TROCAR XCEL NON-BLD 5MMX100MML (ENDOMECHANICALS) ×3 IMPLANT
TUBING INSUFFLATION (TUBING) ×3 IMPLANT

## 2014-11-22 NOTE — Interval H&P Note (Signed)
History and Physical Interval Note: plan laparoscopic appendectomy.  The risks were discussed in detail.  She agrees to proceed.  11/22/2014 7:04 AM  Pamela Travis  has presented today for surgery, with the diagnosis of acute appendicitis  The various methods of treatment have been discussed with the patient and family. After consideration of risks, benefits and other options for treatment, the patient has consented to  Procedure(s): APPENDECTOMY LAPAROSCOPIC (N/A) as a surgical intervention .  The patient's history has been reviewed, patient examined, no change in status, stable for surgery.  I have reviewed the patient's chart and labs.  Questions were answered to the patient's satisfaction.     Theo Reither A

## 2014-11-22 NOTE — Progress Notes (Signed)
UR completed 

## 2014-11-22 NOTE — Anesthesia Preprocedure Evaluation (Signed)
Anesthesia Evaluation  Patient identified by MRN, date of birth, ID band  Reviewed: Allergy & Precautions, NPO status , Patient's Chart, lab work & pertinent test results  Airway Mallampati: I  TM Distance: >3 FB Neck ROM: Full    Dental   Pulmonary          Cardiovascular     Neuro/Psych    GI/Hepatic   Endo/Other    Renal/GU      Musculoskeletal   Abdominal   Peds  Hematology   Anesthesia Other Findings   Reproductive/Obstetrics                             Anesthesia Physical Anesthesia Plan  ASA: I and emergent  Anesthesia Plan: General   Post-op Pain Management:    Induction: Intravenous  Airway Management Planned: Oral ETT  Additional Equipment:   Intra-op Plan:   Post-operative Plan: Extubation in OR  Informed Consent: I have reviewed the patients History and Physical, chart, labs and discussed the procedure including the risks, benefits and alternatives for the proposed anesthesia with the patient or authorized representative who has indicated his/her understanding and acceptance.     Plan Discussed with: CRNA and Surgeon  Anesthesia Plan Comments:         Anesthesia Quick Evaluation

## 2014-11-22 NOTE — Discharge Summary (Signed)
Patient ID: Pamela Travis MRN: [id number] DOB/AGE: 38-Jan-1977 37 y.o.  Admit date: 11/21/2014 Discharge date: 11/22/2014  Procedures: lap appy by Dr. Carman Chingoug Blackman 11-22-14  Consults: None  Reason for Admission: Two days of worsening abdominal pain, now localized to the RLQ, sent over from Urgent Care on [place]. CT demonstrates and confirms acute appendicitis.  Admission Diagnoses:  1. Acute appendicitis  Hospital Course: The patient was admitted and taken to the OR where she underwent a lap appy.  She tolerated this well.  On POD 0, she was tolerating a regular diet and her pain was well controlled.  She was felt stable for dc home.  PE: Abd: soft, appropriately tender, +BS, incisions c/d/i  Discharge Diagnoses:  Active Problems:   Appendicitis s/p lap appy  Discharge Medications:   Medication List    TAKE these medications        HYDROcodone-acetaminophen 5-325 MG per tablet  Commonly known as:  NORCO/VICODIN  Take 1-2 tablets by mouth every 6 (six) hours as needed for moderate pain.     ibuprofen 800 MG tablet  Commonly known as:  ADVIL,MOTRIN  Take 800 mg by mouth every 8 (eight) hours as needed for mild pain.        Discharge Instructions:     Follow-up Information    Follow up with [facility] On 12/11/2014.   Specialty:  General Surgery   Why:  [facility], 2:45pm, arrive no later than 2:15pm for paperwork   Contact information:   [address] [place] [address] [phone]       Signed: Letha CapeOSBORNE,Pasha Gadison E 11/22/2014, 3:50 PM

## 2014-11-22 NOTE — Op Note (Signed)
Appendectomy, Lap, Procedure Note  Indications: The patient presented with a history of right-sided abdominal pain. A CT revealed findings consistent with acute appendicitis.  Pre-operative Diagnosis: Acute appendicitis without mention of peritonitis  Post-operative Diagnosis: Same  Surgeon: Abigail MiyamotoBLACKMAN,Adriana Quinby A   Assistants: 0  Anesthesia: General endotracheal anesthesia  ASA Class: 2  Procedure Details  The patient was seen again in the Holding Room. The risks, benefits, complications, treatment options, and expected outcomes were discussed with the patient and/or family. The possibilities of reaction to medication, perforation of viscus, bleeding, recurrent infection, finding a normal appendix, the need for additional procedures, failure to diagnose a condition, and creating a complication requiring transfusion or operation were discussed. There was concurrence with the proposed plan and informed consent was obtained. The site of surgery was properly noted. The patient was taken to Operating Room, identified as Pamela Travis and the procedure verified as Appendectomy. A Time Out was held and the above information confirmed.  The patient was placed in the supine position and general anesthesia was induced, along with placement of orogastric tube, Venodyne boots, and a Foley catheter. The abdomen was prepped and draped in a sterile fashion. A one centimeter infraumbilical incision was made.  The umbilical stalk was elevated, and the midline fascia was incised with a #11 blade.  A Kelly clamp was used to confirm entrance into the peritoneal cavity.  A pursestring suture was passed around the incision with a 0 Vicryl.  The Hasson was introduced into the abdomen and the tails of the suture were used to hold the Hasson in place.   The pneumoperitoneum was then established to steady pressure of 15 mmHg.  Additional 5 mm cannulas then placed in the left lower quadrant of the abdomen and the  suprapubic region under direct visualization. A careful evaluation of the entire abdomen was carried out. The patient was placed in Trendelenburg and left lateral decubitus position. The small intestines were retracted in the cephalad and left lateral direction away from the pelvis and right lower quadrant. The patient was found to have an enlarged and inflamed appendix that was extending into the pelvis. There was no evidence of perforation.  The appendix was carefully dissected. The appendix was was skeletonized with the harmonic scalpel.   The appendix was divided at its base using an endo-GIA stapler. Minimal appendiceal stump was left in place. There was no evidence of bleeding, leakage, or complication after division of the appendix. Irrigation was also performed and irrigate suctioned from the abdomen as well.  The umbilical port site was closed with the purse string suture. There was no residual palpable fascial defect.  The trocar site skin wounds were closed with 4-0 Monocryl.  Instrument, sponge, and needle counts were correct at the conclusion of the case.   Findings: The appendix was found to be inflamed. There were not signs of necrosis.  There was not perforation. There was not abscess formation.  Estimated Blood Loss:  Minimal         Drains:none         Complications:  None; patient tolerated the procedure well.         Disposition: PACU - hemodynamically stable.         Condition: stable

## 2014-11-22 NOTE — Discharge Instructions (Signed)
[facility] ______[facility] °LAPAROSCOPIC SURGERY: POST OP INSTRUCTIONS °Always review your discharge instruction sheet given to you by the facility where your surgery was performed. °IF YOU HAVE DISABILITY OR FAMILY LEAVE FORMS, YOU MUST BRING THEM TO THE OFFICE FOR PROCESSING.   °DO NOT GIVE THEM TO YOUR DOCTOR. ° °1. A prescription for pain medication may be given to you upon discharge.  Take your pain medication as prescribed, if needed.  If narcotic pain medicine is not needed, then you may take acetaminophen (Tylenol) or ibuprofen (Advil) as needed. °2. Take your usually prescribed medications unless otherwise directed. °3. If you need a refill on your pain medication, please contact your pharmacy.  They will contact our office to request authorization. Prescriptions will not be filled after 5pm or on week-ends. °4. You should follow a light diet the first few days after arrival home, such as soup and crackers, etc.  Be sure to include lots of fluids daily. °5. Most patients will experience some swelling and bruising in the area of the incisions.  Ice packs will help.  Swelling and bruising can take several days to resolve.  °6. It is common to experience some constipation if taking pain medication after surgery.  Increasing fluid intake and taking a stool softener (such as Colace) will usually help or prevent this problem from occurring.  A mild laxative (Milk of Magnesia or Miralax) should be taken according to package instructions if there are no bowel movements after 48 hours. °7. Unless discharge instructions indicate otherwise, you may remove your bandages 24-48 hours after surgery, and you may shower at that time.  You may have steri-strips (small skin tapes) in place directly over the incision.  These strips should be left on the skin for 7-10 days.  If your surgeon used skin glue on the incision, you may shower in 24 hours.  The glue will flake off over the next 2-3 weeks.  Any sutures or  staples will be removed at the office during your follow-up visit. °8. ACTIVITIES:  You may resume regular (light) daily activities beginning the next day--such as daily self-care, walking, climbing stairs--gradually increasing activities as tolerated.  You may have sexual intercourse when it is comfortable.  Refrain from any heavy lifting or straining until approved by your doctor. °a. You may drive when you are no longer taking prescription pain medication, you can comfortably wear a seatbelt, and you can safely maneuver your car and apply brakes. °b. RETURN TO WORK:  __________________________________________________________ °9. You should see your doctor in the office for a follow-up appointment approximately 2-3 weeks after your surgery.  Make sure that you call for this appointment within a day or two after you arrive home to insure a convenient appointment time. °10. OTHER INSTRUCTIONS: __________________________________________________________________________________________________________________________ __________________________________________________________________________________________________________________________ °WHEN TO CALL YOUR DOCTOR: °1. Fever over 101.0 °2. Inability to urinate °3. Continued bleeding from incision. °4. Increased pain, redness, or drainage from the incision. °5. Increasing abdominal pain ° °The clinic staff is available to answer your questions during regular business hours.  Please don’t hesitate to call and ask to speak to one of the nurses for clinical concerns.  If you have a medical emergency, go to the nearest emergency room or call 911.  A surgeon from [facility] is always on call at the hospital. °[address], [address], [place], [place]  [address] ? [address] [address], [place],    [address] °[phone] ? [phone] ? FAX [phone] °Web site:   [email or web address]   Reflujo gastroesofgico - Adultos  (Gastroesophageal  Reflux Disease, Adult)  El reflujo gastroesofgico ocurre cuando el cido del estmago pasa al esfago. Cuando el cido entra en contacto con el esfago, el cido provoca dolor (inflamacin) en el esfago. Con el tiempo, pueden formarse pequeos agujeros (lceras) en el revestimiento del esfago. CAUSAS   Exceso de Runner, broadcasting/film/videopeso corporal. Esto aplica presin Eli Lilly and Companysobre el estmago, lo que hace que el cido del estmago suba hacia el esfago.  El hbito de fumar Aumenta la produccin de cido en el Douglasestmago.  El consumo de alcohol. Provoca disminucin de la presin en el esfnter esofgico inferior (vlvula o anillo de msculo entre el esfago y Investment banker, corporateel estmago), permitiendo que el cido del estmago suba hacia el esfago.  Cenas a ltima hora del da y estmago lleno. Aumenta la presin y la produccin de cido en el estmago.  Malformacin en el esfnter esofgico inferior. A menudo no se halla causa.  SNTOMAS   Ardor y Radiographer, therapeuticdolor en la parte inferior del pecho detrs del esternn y en la zona media del Abbottestmago. Puede ocurrir Toys 'R' Usdos veces por semana o ms a menudo.  Dificultad para tragar.  Dolor de Advertising copywritergarganta.  Tos seca.  Sntomas similares al asma que incluyen sensacin de opresin en el pecho, falta de aire y sibilancias. DIAGNSTICO  El mdico diagnosticar el problema basndose en los sntomas. En algunos casos, se indican radiografas y otras pruebas para verificar si hay complicaciones o para comprobar el estado del [address]est[address] y Training and development officerel esfago.  TRATAMIENTO  El mdico le indicar medicamentos de venta libre o recetados para ayudar a disminuir la produccin de cido. Consulte con su mdico antes de Corporate investment bankerempezar o agregar cualquier medicamento nuevo.  INSTRUCCIONES PARA EL CUIDADO EN EL HOGAR   Modifique los factores que pueda cambiar. Consulte con su mdico para solicitar orientacin relacionada con la prdida de peso, dejar de fumar y el consumo de alcohol.  Evite las comidas y bebidas que [address]empeoran los  [place]problemas, [place]:  [place]esotaBebidas con cafena o alcohlicas.  Chocolate.  Sabores a Advertising account plannermenta.  Ajo y cebolla.  Comidas muy condimentadas.  Ctricos como naranjas, limones o limas.  Alimentos que contengan tomate, como salsas, [place] y pizza.  Alimentos fritos y Lexicographergrasos.  Evite acostarse durante 3 horas antes de irse a dormir o antes de tomar una siesta.  Haga comidas pequeas durante Glass blower/designerel da en lugar de 3 comidas abundantes.  Use ropas sueltas. No use nada apretado alrededor de la cintura que cause presin en el estmago.  Levante (eleve) la cabecera de la cama 6 a 8 pulgadas (15 a 20 cm) con bloques de madera. Usar almohadas extra no ayuda.  Solo tome medicamentos que se pueden comprar sin receta o recetados para el dolor, Dentistmalestar o fiebre, como le indica el mdico.  No tome aspirina, ibuprofeno ni antiinflamatorios no esteroides. SOLICITE ATENCIN MDICA DE Engelhard CorporationNMEDIATO SI:   Goldman SachsSiente dolor en los brazos, el cuello, la Altonmandbula, los dientes o la espalda.  El dolor aumenta o cambia la intensidad o la durancin.  Tiene nuseas, vmitos o sudoracin(diaforesis).  Siente falta de aire o dolor en el pecho, o se desmaya.  Vomita y el vmito tiene Rocky Riversangre, es de color [place], [place]amarillo, negro o es similar a la borra del caf o tiene [place] Bur[place]angre.  Las heces son rojas, sanguinolentas o negras. Estos sntomas pueden ser signos de [address] - [address]ros problemas, como enfermedades cardacas, hemorragias gstrias o sangrado esofgico.  ASEGRESE DE QUE:   Comprende estas instrucciones.  Controlar su enfermedad.  Solicitar ayuda de inmediato si no mejora o si empeora. Document Released: 06/03/2005 Document Revised: 11/16/2011 [facility] Patient Information 2015 [place], [place]. This information is not intended to replace advice given to you by your health care provider. Make sure you discuss any questions you have with your health care provider.

## 2014-11-22 NOTE — ED Notes (Signed)
Consent reviewed in English and patient voiced understanding.   Patient read the consent in Spanish and stated she did not have any other questions.  Consent signed and at the bedside.

## 2014-11-22 NOTE — H&P (Signed)
Pamela Travis is an 39 y.o. female.   Chief Complaint: Abdominal pain  HPI: Two days of worsening abdominal pain, now localized to the RLQ, sent over from Urgent Care on [place].  CT demonstrates and confirms acute appendicitis  History reviewed. No pertinent past medical history.  Past Surgical History  Procedure Laterality Date  . Cesarean section      No family history on file. Social History:  reports that she has never smoked. She does not have any smokeless tobacco history on file. She reports that she does not drink alcohol or use illicit drugs.  Allergies: No Known Allergies   (Not in a hospital admission)  Results for orders placed or performed during the hospital encounter of 11/21/14 (from the past 48 hour(s))  CBC with Differential/Platelet     Status: Abnormal   Collection Time: 11/21/14 11:01 PM  Result Value Ref Range   WBC 10.1 4.0 - 10.5 K/uL   RBC 3.83 (L) 3.87 - 5.11 MIL/uL   Hemoglobin 11.4 (L) 12.0 - 15.0 g/dL   HCT 32.8 (L) 36.0 - 46.0 %   MCV 85.6 78.0 - 100.0 fL   MCH 29.8 26.0 - 34.0 pg   MCHC 34.8 30.0 - 36.0 g/dL   RDW 12.6 11.5 - 15.5 %   Platelets 311 150 - 400 K/uL   Neutrophils Relative % 65 43 - 77 %   Neutro Abs 6.6 1.7 - 7.7 K/uL   Lymphocytes Relative 26 12 - 46 %   Lymphs Abs 2.7 0.7 - 4.0 K/uL   Monocytes Relative 7 3 - 12 %   Monocytes Absolute 0.7 0.1 - 1.0 K/uL   Eosinophils Relative 2 0 - 5 %   Eosinophils Absolute 0.2 0.0 - 0.7 K/uL   Basophils Relative 0 0 - 1 %   Basophils Absolute 0.0 0.0 - 0.1 K/uL  Comprehensive metabolic panel     Status: Abnormal   Collection Time: 11/21/14 11:01 PM  Result Value Ref Range   Sodium 137 135 - 145 mmol/L   Potassium 3.5 3.5 - 5.1 mmol/L   Chloride 102 96 - 112 mmol/L   CO2 25 19 - 32 mmol/L   Glucose, Bld 108 (H) 70 - 99 mg/dL   BUN 7 6 - 23 mg/dL   Creatinine, Ser 0.71 0.50 - 1.10 mg/dL   Calcium 8.6 8.4 - 10.5 mg/dL   Total Protein 6.6 6.0 - 8.3 g/dL   Albumin 3.3 (L) 3.5  - 5.2 g/dL   AST 16 0 - 37 U/L   ALT 15 0 - 35 U/L   Alkaline Phosphatase 89 39 - 117 U/L   Total Bilirubin 0.3 0.3 - 1.2 mg/dL   GFR calc non Af Amer >90 >90 mL/min   GFR calc Af Amer >90 >90 mL/min    Comment: (NOTE) The eGFR has been calculated using the CKD EPI equation. This calculation has not been validated in all clinical situations. eGFR's persistently <90 mL/min signify possible Chronic Kidney Disease.    Anion gap 10 5 - 15   Ct Abdomen Pelvis W Contrast  11/21/2014   CLINICAL DATA:  Right lower quadrant abdominal pain for 2 days. Diarrhea. Initial encounter.  EXAM: CT ABDOMEN AND PELVIS WITH CONTRAST  TECHNIQUE: Multidetector CT imaging of the abdomen and pelvis was performed using the standard protocol following bolus administration of intravenous contrast.  CONTRAST:  137m OMNIPAQUE IOHEXOL 300 MG/ML  SOLN  COMPARISON:  Pelvic ultrasound performed 09/13/2009  FINDINGS: The visualized  lung bases are clear.  The liver and spleen are unremarkable in appearance. The gallbladder is decompressed and within normal limits. The pancreas and adrenal glands are unremarkable.  The kidneys are unremarkable in appearance. There is no evidence of hydronephrosis. No renal or ureteral stones are seen. No perinephric stranding is appreciated.  No free fluid is identified. The small bowel is unremarkable in appearance. The stomach is within normal limits. No acute vascular abnormalities are seen.  The appendix measures up to 1.1 cm in diameter, with mild wall thickening and surrounding soft tissue inflammation, compatible with acute appendicitis. The appendix is predominantly inferior to the cecum. There is no evidence of perforation or abscess formation. Trace free fluid is seen. Mildly prominent pericecal nodes are noted.  Contrast progresses to the level of the ascending colon. The colon is unremarkable in appearance.  The bladder is mildly distended and grossly unremarkable. The uterus is within  normal limits. The ovaries are relatively symmetric. No suspicious adnexal masses are seen. No inguinal lymphadenopathy is seen.  No acute osseous abnormalities are identified.  IMPRESSION: Acute appendicitis noted, with dilatation of the appendix to 1.1 cm in diameter, mild wall thickening and surrounding soft tissue inflammation. Trace free fluid noted. Mildly prominent pericecal nodes seen. No evidence of perforation or abscess formation at this time.  These results were relayed by telephone at the time of interpretation on 11/21/2014 at 9:59 pm to Dr. Lamar Blinks , who verbally acknowledged these results.   Electronically Signed   By: Garald Balding M.D.   On: 11/21/2014 22:01    Review of Systems  Constitutional: Negative for fever and chills.  Gastrointestinal: Positive for nausea, abdominal pain and diarrhea. Negative for vomiting.  All other systems reviewed and are negative.   Blood pressure 111/72, pulse 83, temperature 98.5 F (36.9 C), temperature source Oral, resp. rate 16, height 5' 5" (1.651 m), weight 79.379 kg (175 lb), last menstrual period 11/18/2014, SpO2 99 %. Physical Exam  Constitutional: She is oriented to person, place, and time. She appears well-developed and well-nourished.  HENT:  Head: Normocephalic and atraumatic.  Eyes: Conjunctivae and EOM are normal. Pupils are equal, round, and reactive to light.  Neck: Normal range of motion. Neck supple.  Cardiovascular: Normal rate, regular rhythm and normal heart sounds.   Respiratory: Effort normal and breath sounds normal.  GI: Soft. Normal appearance and bowel sounds are normal. There is tenderness in the right lower quadrant. There is rebound, guarding and tenderness at McBurney's point. There is no rigidity.  Musculoskeletal: Normal range of motion.  Neurological: She is alert and oriented to person, place, and time. She has normal reflexes.  Skin: Skin is warm and dry.  Psychiatric: She has a normal mood and  affect. Her behavior is normal. Judgment and thought content normal.     Assessment/Plan Acute appendicitis without apparent rupture clinically or radiologically.  Has received antibiotics and will be taken to OR soon.  The operation was explained to the patient with her husband in the room.  She appears to have a good understanding of [place].  Darneisha Windhorst, JAY 11/22/2014, 6:06 AM

## 2014-11-22 NOTE — ED Notes (Signed)
Attempted report 

## 2014-11-22 NOTE — Transfer of Care (Signed)
Immediate Anesthesia Transfer of Care Note  Patient: Pamela Travis  Procedure(s) Performed: Procedure(s): APPENDECTOMY LAPAROSCOPIC (N/A)  Patient Location: PACU  Anesthesia Type:General  Level of Consciousness: awake, alert , oriented and patient cooperative  Airway & Oxygen Therapy: Patient Spontanous Breathing  Post-op Assessment: Report given to RN and Post -op Vital signs reviewed and stable  Post vital signs: Reviewed and stable  Last Vitals:  Filed Vitals:   11/22/14 0651  BP: 112/64  Pulse: 77  Temp:   Resp: 16    Complications: No apparent anesthesia complications

## 2014-11-22 NOTE — Anesthesia Postprocedure Evaluation (Signed)
Anesthesia Post Note  Patient: Pamela Travis  Procedure(s) Performed: Procedure(s) (LRB): APPENDECTOMY LAPAROSCOPIC (N/A)  Anesthesia type: general  Patient location: PACU  Post pain: Pain level controlled  Post assessment: Patient's Cardiovascular Status Stable  Last Vitals:  Filed Vitals:   11/22/14 0916  BP: 108/62  Pulse: 66  Temp: 36.6 C  Resp: 15    Post vital signs: Reviewed and stable  Level of consciousness: sedated  Complications: No apparent anesthesia complications

## 2014-11-22 NOTE — Progress Notes (Signed)
D/c to home with family via wheelchair.  Breathing regular and unlabored on room air. Pain med given for d/c.  VSS.  D/c instructions given and understood by patient

## 2014-11-23 ENCOUNTER — Encounter (HOSPITAL_COMMUNITY): Payer: Self-pay | Admitting: Surgery

## 2015-03-03 ENCOUNTER — Ambulatory Visit (INDEPENDENT_AMBULATORY_CARE_PROVIDER_SITE_OTHER): Payer: Self-pay | Admitting: Family Medicine

## 2015-03-03 VITALS — BP 100/60 | HR 80 | Temp 98.1°F | Resp 18 | Ht 64.75 in | Wt 173.6 lb

## 2015-03-03 DIAGNOSIS — F32A Depression, unspecified: Secondary | ICD-10-CM

## 2015-03-03 DIAGNOSIS — F329 Major depressive disorder, single episode, unspecified: Secondary | ICD-10-CM

## 2015-03-03 DIAGNOSIS — M542 Cervicalgia: Secondary | ICD-10-CM

## 2015-03-03 MED ORDER — CITALOPRAM HYDROBROMIDE 40 MG PO TABS: 40.0000 mg | ORAL_TABLET | Freq: Every day | ORAL | Status: DC

## 2015-03-03 MED ORDER — CYCLOBENZAPRINE HCL 10 MG PO TABS: 10.0000 mg | ORAL_TABLET | Freq: Two times a day (BID) | ORAL | Status: DC | PRN

## 2015-03-03 NOTE — Patient Instructions (Signed)
Restart your celexa (citalopram) 40 mg once a day If you like, we can try stopping this again in the future.  However if you do decide to stop this medication please decrease your dose gradually over 2-3 weeks Let me know if you are not feeling back to normal in a week or so Try the flexeril as needed for your neck pain- this will make you sleepy so do not use it when you need to drive Let me know if your neck is not better soon

## 2015-03-03 NOTE — Progress Notes (Signed)
[facility] [address] [place] [address] [phone]  Date:  03/03/2015   Name:  Pamela Travis   DOB:  [birth year]   MRN:  [id number]  PCP:  No PCP Per Patient    Chief Complaint: Medication Refill and Depression   History of Present Illness:  Pamela Travis is a 39 y.o. very pleasant female patient who presents with the following:  Seen by myself once this spring with appendicitis.  She was treated with appendectomy and has done very well.  Here today to discuss stress and her medication- celexa 40 mg  She was seen by Dr. Quintella Reichert last year and was started on celexa.  She ran out about 10 days ago and notes that she is feeling bad since her medication ran out- she notes increased sx of depression and irritability since she stopped the medication.  She feels tired, tearful and irritable and notes that the back of her neck hurts.  Appetite is ok, and she is sleeping well still.  She would like to go back on the celexa at this time   Patient Active Problem List   Diagnosis Date Noted  . Appendicitis 11/22/2014    Past Medical History  Diagnosis Date  . Appendicitis   . Depression     Past Surgical History  Procedure Laterality Date  . Cesarean section    . Appendectomy  11/22/2014  . Laparoscopic appendectomy N/A 11/22/2014    Procedure: APPENDECTOMY LAPAROSCOPIC;  Surgeon: Abigail Miyamoto, MD;  Location: [facility] OR;  Service: General;  Laterality: N/A;    History  Substance Use Topics  . Smoking status: Never Smoker   . Smokeless tobacco: Never Used  . Alcohol Use: No    History reviewed. No pertinent family history.  No Known Allergies  Medication list has been reviewed and updated.  Current Outpatient Prescriptions on File Prior to Visit  Medication Sig Dispense Refill  . HYDROcodone-acetaminophen (NORCO/VICODIN) 5-325 MG per tablet Take 1-2 tablets by mouth every 6 (six) hours as needed for moderate pain. (Patient not  taking: Reported on 03/03/2015) 40 tablet 0  . ibuprofen (ADVIL,MOTRIN) 800 MG tablet Take 800 mg by mouth every 8 (eight) hours as needed for mild pain.      No current facility-administered medications on file prior to visit.    Review of Systems:  As per HPI- otherwise negative.   Physical Examination: Filed Vitals:   03/03/15 1336  BP: 90/52  Pulse: 80  Temp: 98.1 F (36.7 C)  Resp: 18   Filed Vitals:   03/03/15 1336  Height: 5' 4.75" (1.645 m)  Weight: 173 lb 9.6 oz (78.744 kg)   Body mass index is 29.1 kg/(m^2). Ideal Body Weight: Weight in (lb) to have BMI = 25: 148.8  GEN: WDWN, NAD, Non-toxic, A & O x 3, mild overweight, looks well except tearful HEENT: Atraumatic, Normocephalic. Neck supple. No masses, No LAD. Ears and Nose: No external deformity. CV: RRR, No M/G/R. No JVD. No thrill. No extra heart sounds. PULM: CTA B, no wheezes, crackles, rhonchi. No retractions. No resp. distress. No accessory muscle use. EXTR: No c/c/e NEURO Normal gait.  PSYCH: Normally interactive. Conversant. Tearful at times, husband is at her side.  Denies any SI or HI Tenderness in her bilateral trapezius muscles but no stiffness of her neck, normal cervical ROM  No redness or swelling of her neck  Assessment and Plan: Depression - Plan: citalopram (CELEXA) 40 MG tablet  Neck  pain - Plan: cyclobenzaprine (FLEXERIL) 10 MG tablet  Will have her start back on her celexa- she will let me know if her sx are not improved in the next week or so Also flexeril as needed for her neck pain- cautioned regarding sedation and she will let me know if not improving  Signed Abbe Amsterdam, MD

## 2015-04-04 ENCOUNTER — Ambulatory Visit (INDEPENDENT_AMBULATORY_CARE_PROVIDER_SITE_OTHER): Payer: Self-pay | Admitting: Emergency Medicine

## 2015-04-04 VITALS — BP 102/80 | HR 98 | Temp 99.9°F | Resp 16 | Ht 64.75 in | Wt 172.4 lb

## 2015-04-04 DIAGNOSIS — R1032 Left lower quadrant pain: Secondary | ICD-10-CM

## 2015-04-04 DIAGNOSIS — N10 Acute tubulo-interstitial nephritis: Secondary | ICD-10-CM

## 2015-04-04 LAB — POCT CBC
GRANULOCYTE PERCENT: 84.2 % — AB (ref 37–80)
HCT, POC: 34.8 % — AB (ref 37.7–47.9)
HEMOGLOBIN: 11.7 g/dL — AB (ref 12.2–16.2)
LYMPH, POC: 1.5 (ref 0.6–3.4)
MCH, POC: 29 pg (ref 27–31.2)
MCHC: 33.7 g/dL (ref 31.8–35.4)
MCV: 86 fL (ref 80–97)
MID (cbc): 1 — AB (ref 0–0.9)
MPV: 6.2 fL (ref 0–99.8)
POC Granulocyte: 13.3 — AB (ref 2–6.9)
POC LYMPH PERCENT: 9.6 %L — AB (ref 10–50)
POC MID %: 6.2 %M (ref 0–12)
Platelet Count, POC: 315 10*3/uL (ref 142–424)
RBC: 4.05 M/uL (ref 4.04–5.48)
RDW, POC: 12.5 %
WBC: 15.8 10*3/uL — AB (ref 4.6–10.2)

## 2015-04-04 LAB — POCT URINALYSIS DIPSTICK
BILIRUBIN UA: NEGATIVE
Glucose, UA: NEGATIVE
KETONES UA: NEGATIVE
NITRITE UA: NEGATIVE
Protein, UA: NEGATIVE
Spec Grav, UA: <=1.005
Urobilinogen, UA: 0.2
pH, UA: 7

## 2015-04-04 LAB — POCT UA - MICROSCOPIC ONLY
Casts, Ur, LPF, POC: NEGATIVE
Crystals, Ur, HPF, POC: NEGATIVE
Mucus, UA: NEGATIVE
Yeast, UA: NEGATIVE

## 2015-04-04 LAB — POCT URINE PREGNANCY: PREG TEST UR: NEGATIVE

## 2015-04-04 MED ORDER — HYDROCODONE-ACETAMINOPHEN 5-325 MG PO TABS: 1.0000 | ORAL_TABLET | ORAL | Status: DC | PRN

## 2015-04-04 MED ORDER — SULFAMETHOXAZOLE-TRIMETHOPRIM 800-160 MG PO TABS: 1.0000 | ORAL_TABLET | Freq: Two times a day (BID) | ORAL | Status: DC

## 2015-04-04 NOTE — Patient Instructions (Signed)
Pielonefritis - Adultos  °(Pyelonephritis, Adult) ° La pielonefritis es una infección del riñón. Hay dos tipos principales de pielonefritis:  °· Una infección que se inicia rápidamente sin síntomas previos (pielonefritis aguda). °· Infecciones que persisten por un largo período (pielonefritis crónica). °CAUSAS  °Hay dos causas principales:  °· Pasaje de bacterias desde la vejiga al riñón. Este problema aparece especialmente en mujeres embarazadas. La orina en la vejiga puede infectarse por diferentes causas, por ejemplo: °¨ Inflamación de la próstata (prostatitis). °¨ Durante las relaciones sexuales en las mujeres. °¨ Infección en la vejiga (cistitis). °· Pasaje de bacterias desde la sangre hacia el riñón. °Las enfermedades que aumentan el riesgo son:  °· Diabetes. °· Cálculos renales o en la vesícula. °· Cáncer. °· Un catéter colocado en la vejiga. °· Otras anormalidades del riñón o de la uretra. °SÍNTOMAS  °· Dolor abdominal °· Dolor en la zona del costado o flanco. °· Fiebre. °· Escalofríos. °· Malestar estomacal. °· Sangre en la orina (orina oscura). °· Necesidad frecuente de orinar °· Necesidad intensa o persistente de orinar. °· Sensación de ardor o pinchazos al orinar. °DIAGNÓSTICO  °El médico diagnosticará una infección en su riñón basándose en los síntomas. También tomará una muestra de orina.  °TRATAMIENTO  °Generalmente el tratamiento depende de la gravedad de la infección.  °· Si la infección es leve y se diagnostica a tiempo, el médico lo tratará con antibióticos por vía oral y lo dejará irse a su casa. °· Si la infección es más grave, la bacteria podría haber ingresado al torrente sanguíneo. Esto requerirá antibióticos por vía intravenosa y la permanencia en el hospital. Los síntomas pueden incluir: °¨ Fiebre alta. °¨ Dolor intenso en un costado del cuerpo. °¨ Escalofríos °· Aún después de haber permanecido en el hospital, el médico podrá indicarle antibióticos por vía oral durante cierto período de  tiempo. °· Podrá prescribirle otros tratamientos según la causa de la infección. °INSTRUCCIONES PARA EL CUIDADO EN EL HOGAR  °· Tome los antibióticos como se le indicó. Tómelos todos, aunque se sienta mejor. °· Concurra para realizar un control y asegurarse de que la infección ha desaparecido. °· Debe ingerir gran cantidad de líquido para mantener la orina de tono claro o color amarillo pálido. °· Tome medicamentos para la vejiga si siente urgencia para orinar o lo hace con mucha frecuencia. °SOLICITE ATENCIÓN MÉDICA DE INMEDIATO SI:  °· Tiene fiebre o síntomas persistentes durante más de 2 ó 3 días. °· Tiene fiebre y los síntomas empeoran. °· No puede tomar los antibióticos ni ingerir líquidos. °· Comienza a sentir escalofríos. °· Siente debilidad extrema o se desmaya. °· No mejora después de 2 días de tratamiento. °ASEGÚRESE DE QUE:  °· Comprende estas instrucciones. °· Controlará su enfermedad. °· Solicitará ayuda de inmediato si no mejora o empeora. °Document Released: 06/03/2005 Document Revised: 02/23/2012 °ExitCare® Patient Information ©2015 [employer]. This information is not intended to replace advice given to you by your health care provider. Make sure you discuss any questions you have with your health care provider. ° °

## 2015-04-04 NOTE — Progress Notes (Signed)
Subjective:  Patient ID: Pamela Travis, female    DOB: [birth year]  Age: 39 y.o. MRN: [id number]  CC: Back Pain; Excessive Sweating; Cough; Sore Throat; No urge to eat; Generalized Body Aches; Chills; Fever; and Fatigue   HPI [facility] Pamela Travis presents  with pain in her left flank. The radiates around the left lower abdomen. She denies any nausea or vomiting. Has no dysuria urgency or frequency. She does have a fever and chills. She finished her period week ago. She has a history of heavy vaginal flow with her menses. She[facility] with [facility]. She has some nausea no vomiting. She she's had no improvement with over-the-counter medication.  History Pamela Travis has a past medical history of Appendicitis and Depression.   She has past surgical history that includes Cesarean section; Appendectomy (11/22/2014); and laparoscopic appendectomy (N/A, 11/22/2014).   Her  family history is not on file.  She   reports that she has never smoked. She has never used smokeless tobacco. She reports that she does not drink alcohol or use illicit drugs.  Outpatient Prescriptions Prior to Visit  Medication Sig Dispense Refill  . citalopram (CELEXA) 40 MG tablet Take 1 tablet (40 mg total) by mouth daily. 90 tablet 3  . ibuprofen (ADVIL,MOTRIN) 800 MG tablet Take 800 mg by mouth every 8 (eight) hours as needed for mild pain.     . cyclobenzaprine (FLEXERIL) 10 MG tablet Take 1 tablet (10 mg total) by mouth 2 (two) times daily as needed for muscle spasms. (Patient not taking: Reported on 04/04/2015) 15 tablet 0  . HYDROcodone-acetaminophen (NORCO/VICODIN) 5-325 MG per tablet Take 1-2 tablets by mouth every 6 (six) hours as needed for moderate pain. (Patient not taking: Reported on 03/03/2015) 40 tablet 0   No facility-administered medications prior to visit.    History   Social History  . Marital Status: Single    Spouse Name: N/A  . Number  of Children: N/A  . Years of Education: N/A   Social History Main Topics  . Smoking status: Never Smoker   . Smokeless tobacco: Never Used  . Alcohol Use: No  . Drug Use: No  . Sexual Activity: Yes    Birth Control/ Protection: Condom   Other Topics Concern  . None   Social History Narrative     Review of Systems  Constitutional: Positive for fever and chills. Negative for appetite change.  HENT: Negative for congestion, ear pain, postnasal drip, sinus pressure and sore throat.   Eyes: Negative for pain and redness.  Respiratory: Negative for cough, shortness of breath and wheezing.   Cardiovascular: Negative for leg swelling.  Gastrointestinal: Positive for abdominal pain. Negative for nausea, vomiting, diarrhea, constipation and blood in stool.  Endocrine: Negative for polyuria.  Genitourinary: Positive for flank pain and menstrual problem. Negative for dysuria, urgency, frequency and hematuria.  Musculoskeletal: Negative for gait problem.  Skin: Negative for rash.  Neurological: Negative for weakness and headaches.  Psychiatric/Behavioral: Negative for confusion and decreased concentration. The patient is not nervous/anxious.     Objective:  BP 102/80 mmHg  Pulse 98  Temp(Src) 99.9 F (37.7 C) (Oral)  Resp 16  Ht 5' 4.75" (1.645 m)  Wt 172 lb 6.4 oz (78.2 kg)  BMI 28.90 kg/m2  SpO2 99%  LMP 03/28/2015  Physical Exam  Constitutional: She is oriented to person, place, and time. She appears well-developed and well-nourished. No distress.  HENT:  Head: Normocephalic  and atraumatic.  Right Ear: External ear normal.  Left Ear: External ear normal.  Nose: Nose normal.  Eyes: Conjunctivae and EOM are normal. Pupils are equal, round, and reactive to light. No scleral icterus.  Neck: Normal range of motion. Neck supple. No tracheal deviation present.  Cardiovascular: Normal rate, regular rhythm and normal heart sounds.   Pulmonary/Chest: Effort normal. No respiratory  distress. She has no wheezes. She has no rales.  Abdominal: She exhibits no mass. There is no tenderness. There is CVA tenderness. There is no rebound and no guarding.  Musculoskeletal: She exhibits no edema.  Lymphadenopathy:    She has no cervical adenopathy.  Neurological: She is alert and oriented to person, place, and time. Coordination normal.  Skin: Skin is warm and dry. No rash noted.  Psychiatric: She has a normal mood and affect. Her behavior is normal.   she has chills and left CVA tenderness. She really has no abdominal tenderness.    Assessment & Plan:   Pamela Travis was seen today for back pain, excessive sweating, cough, sore throat, no urge to eat, generalized body aches, chills, fever and fatigue.  Diagnoses and all orders for this visit:  Acute pyelonephritis  Left lower quadrant pain Orders: -     POCT UA - Microscopic Only -     POCT urinalysis dipstick -     POCT CBC -     POCT urine pregnancy -     Ambulatory referral to Gynecology  Other orders -     sulfamethoxazole-trimethoprim (BACTRIM DS,SEPTRA DS) 800-160 MG per tablet; Take 1 tablet by mouth 2 (two) times daily. -     HYDROcodone-acetaminophen (NORCO) 5-325 MG per tablet; Take 1-2 tablets by mouth every 4 (four) hours as needed.   I am having Pamela Travis start on sulfamethoxazole-trimethoprim and HYDROcodone-acetaminophen. I am also having her maintain her ibuprofen, HYDROcodone-acetaminophen, citalopram, and cyclobenzaprine.  Meds ordered this encounter  Medications  . sulfamethoxazole-trimethoprim (BACTRIM DS,SEPTRA DS) 800-160 MG per tablet    Sig: Take 1 tablet by mouth 2 (two) times daily.    Dispense:  20 tablet    Refill:  0  . HYDROcodone-acetaminophen (NORCO) 5-325 MG per tablet    Sig: Take 1-2 tablets by mouth every 4 (four) hours as needed.    Dispense:  30 tablet    Refill:  0   She was instructed to take her medication and increase her fluids take Motrin for fever and  chills. She'll follow-up over the weekend if there is no improvement.   Appropriate red flag conditions were discussed with the patient as well as actions that should be taken.  Patient expressed his understanding.  Follow-up: Return if symptoms worsen or fail to improve.  Carmelina Dane, MD   Results for orders placed or performed in visit on 04/04/15  POCT UA - Microscopic Only  Result Value Ref Range   WBC, Ur, HPF, POC 5-10    RBC, urine, microscopic 2-4    Bacteria, U Microscopic 1+    Mucus, UA neg    Epithelial cells, urine per micros 3-6    Crystals, Ur, HPF, POC neg    Casts, Ur, LPF, POC neg    Yeast, UA neg   POCT urinalysis dipstick  Result Value Ref Range   Color, UA yellow    Clarity, UA hazy    Glucose, UA neg    Bilirubin, UA neg    Ketones, UA neg    Spec Grav, UA <=  1.005    Blood, UA mod    pH, UA 7.0    Protein, UA neg    Urobilinogen, UA 0.2    Nitrite, UA neg    Leukocytes, UA small (1+) (A) Negative  POCT CBC  Result Value Ref Range   WBC 15.8 (A) 4.6 - 10.2 K/uL   Lymph, poc 1.5 0.6 - 3.4   POC LYMPH PERCENT 9.6 (A) 10 - 50 %L   MID (cbc) 1.0 (A) 0 - 0.9   POC MID % 6.2 0 - 12 %M   POC Granulocyte 13.3 (A) 2 - 6.9   Granulocyte percent 84.2 (A) 37 - 80 %G   RBC 4.05 4.04 - 5.48 M/uL   Hemoglobin 11.7 (A) 12.2 - 16.2 g/dL   HCT, POC 16.1 (A) 09.6 - 47.9 %   MCV 86.0 80 - 97 fL   MCH, POC 29.0 27 - 31.2 pg   MCHC 33.7 31.8 - 35.4 g/dL   RDW, POC 04.5 %   Platelet Count, POC 315 142 - 424 K/uL   MPV 6.2 0 - 99.8 fL  POCT urine pregnancy  Result Value Ref Range   Preg Test, Ur Negative Negative

## 2015-04-08 ENCOUNTER — Ambulatory Visit (INDEPENDENT_AMBULATORY_CARE_PROVIDER_SITE_OTHER): Payer: Self-pay | Admitting: Emergency Medicine

## 2015-04-08 VITALS — BP 110/68 | HR 67 | Temp 98.3°F | Resp 18 | Ht 65.0 in | Wt 171.2 lb

## 2015-04-08 DIAGNOSIS — G43009 Migraine without aura, not intractable, without status migrainosus: Secondary | ICD-10-CM

## 2015-04-08 MED ORDER — KETOROLAC TROMETHAMINE 60 MG/2ML IM SOLN
60.0000 mg | Freq: Once | INTRAMUSCULAR | Status: AC
  Administered 2015-04-08: 60 mg via INTRAMUSCULAR

## 2015-04-08 MED ORDER — SUMATRIPTAN SUCCINATE 50 MG PO TABS: 50.0000 mg | ORAL_TABLET | Freq: Once | ORAL | Status: DC

## 2015-04-08 MED ORDER — PROMETHAZINE HCL 25 MG/ML IJ SOLN: 25.0000 mg | Freq: Once | INTRAMUSCULAR | Status: DC

## 2015-04-08 MED ORDER — PROMETHAZINE HCL 25 MG/ML IJ SOLN
50.0000 mg | Freq: Once | INTRAMUSCULAR | Status: AC
  Administered 2015-04-08: 50 mg via INTRAMUSCULAR

## 2015-04-08 NOTE — Patient Instructions (Signed)
Cefalea migrañosa °(Migraine Headache) °Una cefalea migrañosa es un dolor intenso y punzante en uno o ambos lados de la cabeza. La migraña puede durar desde 30 minutos hasta varias horas. °CAUSAS  °No siempre se conoce la causa exacta de la cefalea migrañosa. Sin embargo, pueden aparecer cuando los nervios del cerebro se irritan y liberan ciertas sustancias químicas que causan inflamación. Esto ocasiona dolor. °Existen también ciertos factores que pueden desencadenar las migrañas, como los siguientes: °· Alcohol. °· Fumar. °· Estrés. °· La menstruación °· Quesos añejados. °· Los alimentos o las bebidas que contienen nitratos, glutamato, aspartamo o tiramina. °· Falta de sueño. °· Chocolate. °· Cafeína. °· Hambre. °· Actividad física extenuante. °· Fatiga. °· Medicamentos que se usan para tratar el dolor en el pecho (nitroglicerina), píldoras anticonceptivas, estrógeno y algunos medicamentos para la hipertensión arterial. °SIGNOS Y SÍNTOMAS °· Dolor en uno o ambos lados de la cabeza. °· Dolor pulsante o punzante. °· Dolor intenso que impide realizar las actividades diarias. °· Dolor que se agrava por cualquier actividad física. °· Náuseas, vómitos o ambos. °· Mareos. °· Dolor con la exposición a las luces brillantes, a los ruidos fuertes o la actividad. °· Sensibilidad general a las luces brillantes, a los ruidos fuertes o a los olores. °Antes de sufrir una migraña, puede recibir señales de advertencia (aura). Un aura puede incluir: °· Ver las luces intermitentes. °· Ver puntos brillantes, halos o líneas en zigzag. °· Tener una visión en túnel o visión borrosa. °· Sensación de entumecimiento u hormigueo. °· Dificultad para hablar °· Debilidad muscular. °DIAGNÓSTICO  °La cefalea migrañosa se diagnostica en función de lo siguiente: °· Síntomas. °· Examen físico. °· Una TC (tomografía computada) o resonancia magnética de la cabeza. Estas pruebas de diagnóstico por imagen no pueden diagnosticar las migrañas, pero pueden  ayudar a descartar otras causas de las cefaleas. °TRATAMIENTO °Le prescribirán medicamentos para aliviar el dolor y las náuseas. También podrán administrarse medicamentos para ayudar a prevenir las migrañas recurrentes.  °INSTRUCCIONES PARA EL CUIDADO EN EL HOGAR °· Sólo tome medicamentos de venta libre o recetados para calmar el dolor o el malestar, según las indicaciones de su médico. No se recomienda usar los opiáceos a largo plazo. °· Cuando tenga la migraña, acuéstese en un cuarto oscuro y tranquilo °· Lleve un registro diario para averiguar lo que puede provocar las cefaleas migrañosas. Por ejemplo, escriba: °¨ Lo que usted come y bebe. °¨ Cuánto tiempo duerme. °¨ Algún cambio en su dieta o en los medicamentos. °· Limite el consumo de bebidas alcohólicas. °· Si fuma, deje de hacerlo. °· Duerma entre 7 y 9 horas, o según las recomendaciones del médico. °· Limite el estrés. °· Mantenga las luces tenues si le molestan las luces brillantes y la migraña empeora. °SOLICITE ATENCIÓN MÉDICA DE INMEDIATO SI:  °· La migraña se hace cada vez más intensa. °· Tiene fiebre. °· Presenta rigidez en el cuello. °· Tiene pérdida de visión. °· Presenta debilidad muscular o pérdida del control muscular. °· Comienza a perder el equilibrio o tiene problemas para caminar. °· Sufre mareos o se desmaya. °· Tiene síntomas graves que son diferentes a los primeros síntomas. °ASEGÚRESE DE QUE:  °· Comprende estas instrucciones. °· Controlará su afección. °· Recibirá ayuda de inmediato si no mejora o si empeora. °Document Released: 08/24/2005 Document Revised: 06/14/2013 °Exit[employer]® Patient Information ©2015 [employer]. This information is not intended to replace advice given to you by your health care provider. Make sure you discuss any questions you have with your   health care provider. ° ° °

## 2015-04-08 NOTE — Progress Notes (Signed)
Subjective:  Patient ID: Pamela Travis, female    DOB: March 23, 1976  Age: 39 y.o. MRN: [id number]  CC: Headache   HPI Pamela Travis presents  with a migraine headache. Pamela Travis describes a lifetime history of migraines headaches or by usually bitemporal extending over the frontal part of her head that is pulsatile. Pamela Travis is very photophobic and nauseated. Pamela Travis has diaphoresis at times denies any history of head injury or antecedent illness. Pamela Travis has no neurologic symptoms no visual symptoms. Pamela Travis has had no improvement with hydrocodone that Pamela Travis been taking for her pyelonephritis. From that standpoint Pamela Travis is improved Pamela Travis denies any fever or chills. No stool change no further dysuria urgency or frequency or abdominal or flank pain  History Pamela Travis has a past medical history of Appendicitis and Depression.   Pamela Travis has past surgical history that includes Cesarean section; Appendectomy (11/22/2014); and laparoscopic appendectomy (N/A, 11/22/2014).   Her  family history is not on file.  Pamela Travis   reports that Pamela Travis has never smoked. Pamela Travis has never used smokeless tobacco. Pamela Travis reports that Pamela Travis does not drink alcohol or use illicit drugs.  Outpatient Prescriptions Prior to Visit  Medication Sig Dispense Refill  . citalopram (CELEXA) 40 MG tablet Take 1 tablet (40 mg total) by mouth daily. 90 tablet 3  . HYDROcodone-acetaminophen (NORCO) 5-325 MG per tablet Take 1-2 tablets by mouth every 4 (four) hours as needed. 30 tablet 0  . ibuprofen (ADVIL,MOTRIN) 800 MG tablet Take 800 mg by mouth every 8 (eight) hours as needed for mild pain.     Marland Kitchen sulfamethoxazole-trimethoprim (BACTRIM DS,SEPTRA DS) 800-160 MG per tablet Take 1 tablet by mouth 2 (two) times daily. 20 tablet 0  . cyclobenzaprine (FLEXERIL) 10 MG tablet Take 1 tablet (10 mg total) by mouth 2 (two) times daily as needed for muscle spasms. (Patient not taking: Reported on 04/04/2015) 15 tablet 0  . HYDROcodone-acetaminophen (NORCO/VICODIN)  5-325 MG per tablet Take 1-2 tablets by mouth every 6 (six) hours as needed for moderate pain. (Patient not taking: Reported on 03/03/2015) 40 tablet 0   No facility-administered medications prior to visit.    History   Social History  . Marital Status: Single    Spouse Name: N/A  . Number of Children: N/A  . Years of Education: N/A   Social History Main Topics  . Smoking status: Never Smoker   . Smokeless tobacco: Never Used  . Alcohol Use: No  . Drug Use: No  . Sexual Activity: Yes    Birth Control/ Protection: Condom   Other Topics Concern  . None   Social History Narrative     Review of Systems  Constitutional: Negative for fever, chills and appetite change.  HENT: Negative for congestion, ear pain, postnasal drip, sinus pressure and sore throat.   Eyes: Negative for pain and redness.  Respiratory: Negative for cough, shortness of breath and wheezing.   Cardiovascular: Negative for leg swelling.  Gastrointestinal: Positive for nausea. Negative for vomiting, abdominal pain, diarrhea, constipation and blood in stool.  Endocrine: Negative for polyuria.  Genitourinary: Negative for dysuria, urgency, frequency and flank pain.  Musculoskeletal: Negative for gait problem.  Skin: Negative for rash.  Neurological: Positive for headaches. Negative for weakness.  Psychiatric/Behavioral: Negative for confusion and decreased concentration. The patient is not nervous/anxious.     Objective:  BP 110/68 mmHg  Pulse 67  Temp(Src) 98.3 F (36.8 C) (Oral)  Resp 18  Ht 5\' 5"  (1.651 m)  Wt 171 lb  3.2 oz (77.656 kg)  BMI 28.49 kg/m2  SpO2 99%  LMP 03/28/2015  Physical Exam  Constitutional: Pamela Travis is oriented to person, place, and time. Pamela Travis appears well-developed and well-nourished. No distress.  HENT:  Head: Normocephalic and atraumatic.  Right Ear: External ear normal.  Left Ear: External ear normal.  Nose: Nose normal.  Eyes: Conjunctivae and EOM are normal. Pupils are  equal, round, and reactive to light. No scleral icterus.  Neck: Normal range of motion. Neck supple. No tracheal deviation present.  Cardiovascular: Normal rate, regular rhythm and normal heart sounds.   Pulmonary/Chest: Effort normal. No respiratory distress. Pamela Travis has no wheezes. Pamela Travis has no rales.  Abdominal: Pamela Travis exhibits no mass. There is no tenderness. There is no rebound and no guarding.  Musculoskeletal: Pamela Travis exhibits no edema.  Lymphadenopathy:    Pamela Travis has no cervical adenopathy.  Neurological: Pamela Travis is alert and oriented to person, place, and time. Coordination normal.  Skin: Skin is warm and dry. No rash noted.  Psychiatric: Pamela Travis has a normal mood and affect. Her behavior is normal.      Assessment & Plan:   Pamela Travis was seen today for headache.  Diagnoses and all orders for this visit:  Migraine without aura and without status migrainosus, not intractable Orders: -     ketorolac (TORADOL) injection 60 mg; Inject 2 mLs (60 mg total) into the muscle once. -     Discontinue: promethazine (PHENERGAN) injection 25 mg; Inject 1 mL (25 mg total) into the muscle once. -     promethazine (PHENERGAN) injection 50 mg; Inject 2 mLs (50 mg total) into the muscle once.  Other orders -     SUMAtriptan (IMITREX) 50 MG tablet; Take 1 tablet (50 mg total) by mouth once. May repeat in 2 hours if headache persists or recurs.   I am having Pamela Travis start on SUMAtriptan. I am also having her maintain her ibuprofen, HYDROcodone-acetaminophen, citalopram, cyclobenzaprine, sulfamethoxazole-trimethoprim, and HYDROcodone-acetaminophen. We administered ketorolac and promethazine.  Meds ordered this encounter  Medications  . ketorolac (TORADOL) injection 60 mg    Sig:   . DISCONTD: promethazine (PHENERGAN) injection 25 mg    Sig:   . SUMAtriptan (IMITREX) 50 MG tablet    Sig: Take 1 tablet (50 mg total) by mouth once. May repeat in 2 hours if headache persists or recurs.    Dispense:  10  tablet    Refill:  5  . promethazine (PHENERGAN) injection 50 mg    Sig:    Pamela Travis was given Phenergan and Toradol and had some modest improvement in her headaches Pamela Travis was discharged to care of her aunt  Appropriate red flag conditions were discussed with the patient as well as actions that should be taken.  Patient expressed his understanding.  Follow-up: Return if symptoms worsen or fail to improve.  Carmelina Dane, MD

## 2015-10-03 ENCOUNTER — Ambulatory Visit: Payer: Self-pay | Admitting: Gynecology

## 2016-02-28 ENCOUNTER — Ambulatory Visit (INDEPENDENT_AMBULATORY_CARE_PROVIDER_SITE_OTHER): Payer: Self-pay | Admitting: Urgent Care

## 2016-02-28 VITALS — BP 102/60 | HR 73 | Temp 98.2°F | Resp 16 | Ht 65.0 in | Wt 176.8 lb

## 2016-02-28 DIAGNOSIS — R109 Unspecified abdominal pain: Secondary | ICD-10-CM

## 2016-02-28 DIAGNOSIS — N898 Other specified noninflammatory disorders of vagina: Secondary | ICD-10-CM

## 2016-02-28 DIAGNOSIS — K30 Functional dyspepsia: Secondary | ICD-10-CM

## 2016-02-28 DIAGNOSIS — F329 Major depressive disorder, single episode, unspecified: Secondary | ICD-10-CM

## 2016-02-28 DIAGNOSIS — F32A Depression, unspecified: Secondary | ICD-10-CM

## 2016-02-28 DIAGNOSIS — R8290 Unspecified abnormal findings in urine: Secondary | ICD-10-CM

## 2016-02-28 LAB — POCT CBC
GRANULOCYTE PERCENT: 77.7 % (ref 37–80)
HEMATOCRIT: 33.2 % — AB (ref 37.7–47.9)
HEMOGLOBIN: 12 g/dL — AB (ref 12.2–16.2)
Lymph, poc: 1.8 (ref 0.6–3.4)
MCH: 31.2 pg (ref 27–31.2)
MCHC: 36.2 g/dL — AB (ref 31.8–35.4)
MCV: 86.3 fL (ref 80–97)
MID (cbc): 0.6 (ref 0–0.9)
MPV: 6.3 fL (ref 0–99.8)
POC GRANULOCYTE: 8.4 — AB (ref 2–6.9)
POC LYMPH PERCENT: 17.1 %L (ref 10–50)
POC MID %: 5.2 % (ref 0–12)
Platelet Count, POC: 301 10*3/uL (ref 142–424)
RBC: 3.85 M/uL — AB (ref 4.04–5.48)
RDW, POC: 11.9 %
WBC: 10.8 10*3/uL — AB (ref 4.6–10.2)

## 2016-02-28 LAB — POC MICROSCOPIC URINALYSIS (UMFC): Mucus: ABSENT

## 2016-02-28 LAB — POCT URINALYSIS DIP (MANUAL ENTRY)
BILIRUBIN UA: NEGATIVE
Bilirubin, UA: NEGATIVE
Glucose, UA: NEGATIVE
Nitrite, UA: NEGATIVE
PROTEIN UA: NEGATIVE
Urobilinogen, UA: 0.2
pH, UA: 6

## 2016-02-28 LAB — POCT WET + KOH PREP
Trich by wet prep: ABSENT
YEAST BY KOH: ABSENT
Yeast by wet prep: ABSENT

## 2016-02-28 LAB — POCT GLYCOSYLATED HEMOGLOBIN (HGB A1C): HEMOGLOBIN A1C: 5.7

## 2016-02-28 MED ORDER — CITALOPRAM HYDROBROMIDE 20 MG PO TABS: ORAL_TABLET | ORAL | Status: DC

## 2016-02-28 MED ORDER — HYDROCODONE-ACETAMINOPHEN 5-325 MG PO TABS: 1.0000 | ORAL_TABLET | Freq: Four times a day (QID) | ORAL | Status: DC | PRN

## 2016-02-28 MED ORDER — METRONIDAZOLE 500 MG PO TABS: 500.0000 mg | ORAL_TABLET | Freq: Two times a day (BID) | ORAL | Status: DC

## 2016-02-28 NOTE — Patient Instructions (Addendum)
Celexa - Semana 1-2: Tome 1/2 pastilla diaramente. Semana 3-4: Tome 1 pastilla diaramente.   Clico renal (Renal Colic) El clico renal es un dolor causado por el paso de un clculo en el rin. El dolor puede ser agudo e intenso. Puede sentirse en la espalda, el abdomen, al costado (fosa lumbar) o la ingle. Puede causar nuseas. El clico renal puede aparecer y Armed forces operational officer. INSTRUCCIONES PARA EL CUIDADO EN EL HOGAR Controle su afeccin para ver si hay cambios. Las siguientes medidas pueden servir para Public house manager cualquier molestia que est sintiendo:  Tome los medicamentos solamente como se lo haya indicado el mdico.  Pregntele al mdico si puede tomar analgsicos de [place].  Beba suficiente lquido para Consulting civil engineer orina clara o de color amarillo plido. Bonnita Nasuti entre 6 y 8vasos de agua por Training and development officer.  Limite la cantidad de sal que consume a menos de 2gramos por da.  Reduzca la cantidad de protenas de la dieta. Consuma menos carne, pescado, frutos secos y productos lcteos.  Evite alimentos como la espinaca, el ruibarbo, los frutos secos o el salvado, ya que pueden aumentar la probabilidad de que se formen clculos. SOLICITE ATENCIN MDICA SI:  Tiene fiebre o siente escalofros.  La orina se torna de color turbio o tiene [employer].  Siente dolor o ardor al Garment/textile technologist. SOLICITE ATENCIN MDICA DE INMEDIATO SI:  El dolor en la fosa lumbar o la ingle se intensifica repentinamente.  Est confundido o desorientado, o pierde la conciencia.   Esta informacin no tiene Marine scientist el consejo del mdico. Asegrese de hacerle al mdico cualquier pregunta que tenga.   Document Released: 06/03/2005 Document Revised: 09/14/2014 Elsevier Interactive Patient Education 2016 [employer].    Metronidazole tablets or capsules Qu es este medicamento? El METRONIDAZOL es un antiinfeccioso. Se utiliza en el tratamiento de ciertos tipos de infecciones bacterianas y por protozoos. No es  efectivo para resfros, gripe u otras infecciones de origen viral. Este medicamento puede ser utilizado para otros usos; si tiene alguna pregunta consulte con su proveedor de atencin mdica o con su farmacutico. Qu le debo informar a mi profesional de la salud antes de tomar este medicamento? Necesita saber si usted presenta alguno de los siguientes problemas o situaciones: -anemia u otros trastornos sanguneos -enfermedad del sistema nervioso -infeccin mictica o por levadura -si consume bebidas alcohlicas -enfermedad heptica -convulsiones -una reaccin alrgica o inusual al metronidazol, a otros medicamentos, alimentos, colorantes o conservantes -si est embarazada o buscando quedar embarazada -si est amamantando a un beb Cmo debo utilizar este medicamento? Tome este medicamento por va oral con un vaso lleno de agua. Siga las instrucciones de la etiqueta del [place]. Tome sus dosis a intervalos regulares. No tome su medicamento con una frecuencia mayor a la indicada. Complete todo el tratamiento con el medicamento como recetado aun si se siente mejor. No omita ninguna dosis o suspenda el uso de su medicamento antes de lo indicado. Hable con su pediatra para informarse acerca del uso de este medicamento en nios. Puede requerir atencin especial. Sobredosis: Pngase en contacto inmediatamente con un centro toxicolgico o una sala de urgencia si usted cree que haya tomado demasiado medicamento. ATENCIN: [employer] es solo para usted. No comparta este medicamento con nadie. Qu sucede si me olvido de una dosis? Si olvida una dosis, tmela lo antes posible. Si es casi la hora de la prxima dosis, tome slo esa dosis. No tome dosis adicionales o dobles. Qu puede interactuar con este medicamento? No tome esta  medicina con ninguno de los siguientes medicamentos: -alcohol o cualquier producto que contiene alcohol -solucin oral de  amprenavir -cisapride -disulfiram -dofetilida -dronedarona -inyeccin de paclitaxel -pimozida -solucin oral de ritonavir -solucin oral de sertralina -inyeccin de sulfametoxasol-trimetoprima -tioridazina -ziprasidona Esta medicina tambin puede interactuar con los siguientes medicamentos: -pldoras anticonceptivas -cimetidina -litio -otros medicamentos que prolongan el intervalo QT (provoca un ritmo cardiaco anormal) -fenobarbital -fenitona -warfarina Puede ser que esta lista no menciona todas las posibles interacciones. Informe a su profesional de [employer]	[place] de [employer] productos a base de hierbas, medicamentos de [place] o suplementos nutritivos que est tomando. Si usted fuma, consume bebidas alcohlicas o si utiliza drogas ilegales, indqueselo tambin a su profesional de [employer]	[place]. Algunas sustancias pueden interactuar con su medicamento. A qu debo estar atento al usar [employer]? Consulte a su mdico o a su profesional de la salud si sus sntomas no mejoran o si empeoran. Puede experimentar mareos o somnolencia. No conduzca ni utilice maquinaria ni haga nada que Associate Professor en estado de alerta hasta que sepa cmo le afecta este medicamento. No se siente ni se ponga de pie con rapidez, especialmente si es un paciente de edad avanzada. Esto reduce el riesgo de mareos o [employer]. Evite las bebidas alcohlicas durante el tratamiento con este medicamento y [employer] tres das siguientes. El alcohol puede causarle mareos o hacerlo sentir enfermo o ruborizado. Si est recibiendo tratamiento para una enfermedad de transmisin sexual, no tenga relaciones sexuales hasta que haya completado el [place]. Es posible que su pareja tambin necesite [place]. Qu efectos secundarios puedo tener al [employer] este medicamento? Efectos secundarios que debe informar a su mdico o a Barrister's clerk de la salud tan pronto como sea posible: -Chief of Staff como erupcin  cutnea o urticarias, hinchazn de la cara, labios o lengua -confusin, torpeza -dificultad para hablar -mareos -decoloracin o dolor de la boca -fiebre, infeccin -entumecimiento, hormigueo, dolor o debilidad en las manos o los pies -dificultad para orinar o cambios en el volumen de orina -enrojecimiento, formacin de ampollas, descamacin o distensin de la piel, inclusive dentro de la boca -convulsiones -cansancio o debilidad inusual -irritacin, resequedad o flujo de la vagina Efectos secundarios que, por lo general, no requieren atencin mdica (debe informarlos a su mdico o a su profesional de la salud si persisten o si son molestos): -diarrea -dolor de cabeza -irritabilidad -sabor metlico -nuseas -calambres o dolores estomacales -dificultad para conciliar el sueo Puede ser que esta lista no menciona todos los posibles efectos secundarios. Comunquese a su mdico por asesoramiento mdico [employer]. Usted puede informar los efectos secundarios a la FDA por telfono al 1-800-FDA-1088. Dnde debo guardar mi medicina? Mantngala fuera del alcance de los nios. Gurdela a [employer], menos de 25 grados C (77 grados F). Protjala de la luz. Mantenga el envase bien cerrado. Deseche todo el medicamento que no haya utilizado, despus de la fecha de vencimiento. ATENCIN: Este folleto es un resumen. Puede ser que no cubra toda la posible informacin. Si usted tiene preguntas acerca de esta medicina, consulte con su mdico, su farmacutico o su profesional de Technical sales engineer.    2016, Elsevier/Gold Standard. (2014-10-16 00:00:00)       IF you received an x-ray today, you will receive an invoice from [facility]. Please contact [facility] at [phone] with questions or concerns regarding your invoice.   IF you received labwork today, you will receive an invoice from [employer]. Please contact [facility] at  [phone] with questions or concerns regarding your invoice.   Our billing staff will not be able to assist you with questions regarding bills from these companies.  You will be contacted with the lab results as soon as they are available. The fastest way to get your results is to activate your My Chart account. Instructions are located on the last page of this paperwork. If you have not heard from [place] regarding the results in 2 weeks, please contact this office.

## 2016-02-28 NOTE — Progress Notes (Signed)
MRN: [id number] DOB: May 16, 1976  Subjective:   HPI obtained in Spanish.   Pamela Travis is a 40 y.o. female presenting for chief complaint of Back Pain and Abdominal Pain  Flank Pain - Reports 2 day history of vaginal discomfort, vaginal odor, right flank pain that radiates to right abdominal side pain, subjective fever, chills, urinary frequency. Has tried amoxicillin in the past with good results for treatment of cystitis and pyelonephritis per patient. Does not hydrate well. Tries to eat healthily except at home. Does not exercise. Drinks sodas.  Depression - Patient would like to have a refill of her Celexa. She was previously on 40mg . States that she feels she's having difficulty with this again. She has not been on this medication for ~1 year. Denies SI, HI.  Pamela Travis has a current medication list which includes the following prescription(s): citalopram, hydrocodone-acetaminophen, ibuprofen, and sumatriptan. Also has No Known Allergies.  Pamela Travis  has a past medical history of Appendicitis and Depression. Also  has past surgical history that includes Cesarean section; Appendectomy (11/22/2014); and laparoscopic appendectomy (N/A, 11/22/2014).  Objective:   Vitals: BP 102/60 mmHg  Pulse 73  Temp(Src) 98.2 F (36.8 C) (Oral)  Resp 16  Ht 5\' 5"  (1.651 m)  Wt 176 lb 12.8 oz (80.196 kg)  BMI 29.42 kg/m2  SpO2 98%  LMP 02/17/2016  Physical Exam  Constitutional: She is oriented to person, place, and time. She appears well-developed and well-nourished.  HENT:  Mouth/Throat: Oropharynx is clear and moist.  Cardiovascular: Normal rate, regular rhythm and intact distal pulses.  Exam reveals no gallop and no friction rub.   No murmur heard. Pulmonary/Chest: No respiratory distress. She has no wheezes. She has no rales.  Abdominal: Soft. Bowel sounds are normal. She exhibits no distension and no mass. There is no tenderness.  Neurological: She is alert and oriented to  person, place, and time.  Skin: Skin is warm and dry.   Results for orders placed or performed in visit on 02/28/16 (from the past 24 hour(s))  POCT urinalysis dipstick     Status: Abnormal   Collection Time: 02/28/16  4:34 PM  Result Value Ref Range   Color, UA yellow yellow   Clarity, UA clear clear   Glucose, UA negative negative   Bilirubin, UA negative negative   Ketones, POC UA negative negative   Spec Grav, UA <=1.005    Blood, UA moderate (A) negative   pH, UA 6.0    Protein Ur, POC negative negative   Urobilinogen, UA 0.2    Nitrite, UA Negative Negative   Leukocytes, UA moderate (2+) (A) Negative  POCT CBC     Status: Abnormal   Collection Time: 02/28/16  4:35 PM  Result Value Ref Range   WBC 10.8 (A) 4.6 - 10.2 K/uL   Lymph, poc 1.8 0.6 - 3.4   POC LYMPH PERCENT 17.1 10 - 50 %L   MID (cbc) 0.6 0 - 0.9   POC MID % 5.2 0 - 12 %M   POC Granulocyte 8.4 (A) 2 - 6.9   Granulocyte percent 77.7 37 - 80 %G   RBC 3.85 (A) 4.04 - 5.48 M/uL   Hemoglobin 12.0 (A) 12.2 - 16.2 g/dL   HCT, POC 25.333.2 (A) 66.437.7 - 47.9 %   MCV 86.3 80 - 97 fL   MCH, POC 31.2 27 - 31.2 pg   MCHC 36.2 (A) 31.8 - 35.4 g/dL   RDW, POC 40.311.9 %   Platelet Count,  POC 301 142 - 424 K/uL   MPV 6.3 0 - 99.8 fL  POCT Microscopic Urinalysis ([facility])     Status: Abnormal   Collection Time: 02/28/16  4:41 PM  Result Value Ref Range   WBC,UR,HPF,POC Too numerous to count  (A) None WBC/hpf   RBC,UR,HPF,POC None None RBC/hpf   Bacteria None None, Too numerous to count   Mucus Absent Absent   Epithelial Cells, UR Per Microscopy Few (A) None, Too numerous to count cells/hpf  POCT Wet + KOH Prep     Status: Abnormal   Collection Time: 02/28/16  5:21 PM  Result Value Ref Range   Yeast by KOH Absent Present, Absent   Yeast by wet prep Absent Present, Absent   WBC by wet prep Few None, Few, Too numerous to count   Clue Cells Wet Prep HPF POC None None, Too numerous to count   Trich by wet prep Absent Present, Absent    Bacteria Wet Prep HPF POC None None, Few, Too numerous to count   Epithelial Cells By Principal FinancialWet Pref ([facility]) Moderate (A) None, Few, Too numerous to count   RBC,UR,HPF,POC Few (A) None RBC/hpf  POCT glycosylated hemoglobin (Hb A1C)     Status: None   Collection Time: 02/28/16  5:23 PM  Result Value Ref Range   Hemoglobin A1C 5.7    Assessment and Plan :   1. Right flank pain 2. Belly pain 3. Abnormal urinalysis - Advised aggressive hydration, strain urine. Refilled pain medication. It is possible patient is having renal stone. For now, we will hold off on imaging. Consider this if the pain persists. Patient agreed.  4. Vaginal discharge - Unclear etiology. Patient agreed to [facility]. Start Flagyl course for 7 days.  5. Depression - Restart Celexa. Titrate dose up to 20mg  over 4 weeks, rtc at that point.  Wallis BambergMario Tony Friscia, PA-C [facility] [phone] 02/28/2016 4:56 PM

## 2016-02-29 NOTE — Progress Notes (Signed)
Do we know what she uses for contraception? Is there any possibility of pregnancy?

## 2016-02-29 NOTE — Progress Notes (Signed)
She uses condoms very consistently. I didn't run the UPT. Should I bring her back for this? Thank you for the input Dr. Cleta Albertsaub.

## 2016-03-01 NOTE — Progress Notes (Signed)
I  get a pregnancy test on every Young female with any abdominal complaints she could stop by for an orders only encounter regnancy test or even do a home pregnancy test because they are very accurate..Marland Kitchen

## 2016-03-02 ENCOUNTER — Ambulatory Visit (HOSPITAL_COMMUNITY)
Admission: RE | Admit: 2016-03-02 | Discharge: 2016-03-02 | Disposition: A | Discharge status: Home / Self Care | Payer: MEDICAID | Source: Ambulatory Visit | Attending: Urgent Care | Admitting: Urgent Care

## 2016-03-02 ENCOUNTER — Encounter (HOSPITAL_COMMUNITY): Payer: Self-pay

## 2016-03-02 ENCOUNTER — Ambulatory Visit (INDEPENDENT_AMBULATORY_CARE_PROVIDER_SITE_OTHER): Payer: Self-pay | Admitting: Urgent Care

## 2016-03-02 ENCOUNTER — Ambulatory Visit (INDEPENDENT_AMBULATORY_CARE_PROVIDER_SITE_OTHER): Payer: Self-pay

## 2016-03-02 ENCOUNTER — Telehealth: Payer: Self-pay | Admitting: Family Medicine

## 2016-03-02 VITALS — BP 112/74 | HR 80 | Temp 97.9°F | Resp 16 | Ht 65.0 in | Wt 174.8 lb

## 2016-03-02 DIAGNOSIS — R10A0 Flank pain, unspecified side: Secondary | ICD-10-CM

## 2016-03-02 DIAGNOSIS — R109 Unspecified abdominal pain: Secondary | ICD-10-CM

## 2016-03-02 DIAGNOSIS — R112 Nausea with vomiting, unspecified: Secondary | ICD-10-CM

## 2016-03-02 DIAGNOSIS — R8290 Unspecified abnormal findings in urine: Secondary | ICD-10-CM

## 2016-03-02 DIAGNOSIS — K5900 Constipation, unspecified: Secondary | ICD-10-CM

## 2016-03-02 DIAGNOSIS — R1084 Generalized abdominal pain: Secondary | ICD-10-CM

## 2016-03-02 DIAGNOSIS — R93421 Abnormal radiologic findings on diagnostic imaging of right kidney: Secondary | ICD-10-CM | POA: Insufficient documentation

## 2016-03-02 LAB — COMPLETE METABOLIC PANEL WITH GFR
ALBUMIN: 3.6 g/dL (ref 3.6–5.1)
ALK PHOS: 82 U/L (ref 33–115)
ALT: 14 U/L (ref 6–29)
AST: 10 U/L (ref 10–30)
BILIRUBIN TOTAL: 0.3 mg/dL (ref 0.2–1.2)
BUN: 8 mg/dL (ref 7–25)
CO2: 28 mmol/L (ref 20–31)
CREATININE: 0.57 mg/dL (ref 0.50–1.10)
Calcium: 8.5 mg/dL — ABNORMAL LOW (ref 8.6–10.2)
Chloride: 103 mmol/L (ref 98–110)
GFR, Est African American: >89 mL/min (ref >=60)
GLUCOSE: 111 mg/dL — AB (ref 65–99)
Potassium: 3.9 mmol/L (ref 3.5–5.3)
Sodium: 139 mmol/L (ref 135–146)
Total Protein: 6.7 g/dL (ref 6.1–8.1)

## 2016-03-02 LAB — POCT URINALYSIS DIP (MANUAL ENTRY)
BILIRUBIN UA: NEGATIVE
BILIRUBIN UA: NEGATIVE
GLUCOSE UA: NEGATIVE
Nitrite, UA: NEGATIVE
PROTEIN UA: NEGATIVE
Spec Grav, UA: <=1.005
Urobilinogen, UA: 0.2
pH, UA: 7

## 2016-03-02 LAB — POCT CBC
Granulocyte percent: 68.1 %G (ref 37–80)
HEMATOCRIT: 31.7 % — AB (ref 37.7–47.9)
Hemoglobin: 11.5 g/dL — AB (ref 12.2–16.2)
Lymph, poc: 2.1 (ref 0.6–3.4)
MCH, POC: 31.1 pg (ref 27–31.2)
MCHC: 36.4 g/dL — AB (ref 31.8–35.4)
MCV: 85.4 fL (ref 80–97)
MID (cbc): 0.6 (ref 0–0.9)
MPV: 6 fL (ref 0–99.8)
POC GRANULOCYTE: 5.9 (ref 2–6.9)
POC LYMPH PERCENT: 24.7 %L (ref 10–50)
POC MID %: 7.2 %M (ref 0–12)
Platelet Count, POC: 338 10*3/uL (ref 142–424)
RBC: 3.71 M/uL — AB (ref 4.04–5.48)
RDW, POC: 12.2 %
WBC: 8.7 10*3/uL (ref 4.6–10.2)

## 2016-03-02 LAB — POC MICROSCOPIC URINALYSIS (UMFC): MUCUS RE: ABSENT

## 2016-03-02 LAB — POCT URINE PREGNANCY: Preg Test, Ur: NEGATIVE

## 2016-03-02 MED ORDER — DIATRIZOATE MEGLUMINE & SODIUM 66-10 % PO SOLN
30.0000 mL | Freq: Once | ORAL | Status: AC
  Administered 2016-03-02: 30 mL via ORAL

## 2016-03-02 MED ORDER — DOCUSATE SODIUM 50 MG PO CAPS: 50.0000 mg | ORAL_CAPSULE | Freq: Two times a day (BID) | ORAL | Status: DC

## 2016-03-02 MED ORDER — CIPROFLOXACIN HCL 500 MG PO TABS: 500.0000 mg | ORAL_TABLET | Freq: Two times a day (BID) | ORAL | Status: DC

## 2016-03-02 MED ORDER — POLYETHYLENE GLYCOL 3350 17 GM/SCOOP PO POWD: 17.0000 g | Freq: Every day | ORAL | Status: DC | PRN

## 2016-03-02 MED ORDER — IOPAMIDOL (ISOVUE-300) INJECTION 61%
100.0000 mL | Freq: Once | INTRAVENOUS | Status: AC | PRN
  Administered 2016-03-02: 100 mL via INTRAVENOUS

## 2016-03-02 NOTE — Patient Instructions (Addendum)
Scheduled for CT Abdomen/ Pelvis today @ 4pm Need to arrive at 130 pm A [facility] to start drinking contrast. Address: [address], [place], [place] [address] Phone: [phone]   Dolor en el flanco  (Flank Pain) El dolor en el flanco se refiere a aquel dolor que se localiza en un lado del cuerpo, entre la zona superior del abdomen y la espalda. Puede aparecer en un perodo corto de tiempo (agudo) o puede durar mucho tiempo o ser recurrente (crnico). Puede ser leve o intenso. Puede tener diferentes causas.  CAUSAS  Algunas de las causas ms comunes son:   Esguince muscular.   Espasmos musculares.   Problemas en la columna vertebral (enfermedad de los discos).   Infeccin en el pulmn (neumona).   Lquido en los pulmones (edema pulmonar).   Infeccin renal.   Piedras en el rin.   Una erupcin cutnea muy dolorosa causada por el virus de la varicela (herpes zoster).  Enfermedad de la vescula biliar. INSTRUCCIONES PARA EL CUIDADO EN EL HOGAR  Los cuidados en el hogar dependern de la causa del dolor. En general:   Haga reposo segn las indicaciones del mdico.  Debe ingerir gran cantidad de lquido para mantener la orina de tono claro o color amarillo plido.  Tome slo medicamentos de venta libre o recetados, segn las indicaciones del mdico. Algunos medicamentos ayudan a Engineer, materialsaliviar el dolor.  Informe al mdico si tiene algn Arboriculturistcambio en el dolor.  Concurra a las visitas de control, segn las indicaciones. SOLICITE ATENCIN MDICA DE INMEDIATO SI:   El dolor no se alivia con los United Parcelmedicamentos.   Tiene sntomas nuevos o que empeoran.  El dolor [place].   Siente dolor abdominal.   Le falta el aire.   Tiene nuseas o vmitos persistentes.   El abdomen se hincha.   Sufre mareos o se desmaya.   Observa sangre en la orina.  Tiene fiebre o sntomas persistentes durante ms de 2  3 das.  Tiene fiebre y los sntomas empeoran  repentinamente. ASEGRESE DE QUE:   Comprende estas instrucciones.  Controlar su enfermedad.  Solicitar ayuda de inmediato si no mejora o si empeora.   Esta informacin no tiene Theme park managercomo fin reemplazar el consejo del mdico. Asegrese de hacerle al mdico cualquier pregunta que tenga.   Document Released: 12/01/2007 Document Revised: 05/18/2012 Elsevier Interactive Patient Education 2016 [employer]    Estreimiento - Adultos (Constipation, Adult) Estreimiento significa que una persona tiene menos de tres evacuaciones en una semana, dificultad para defecar, o que las heces son secas, duras, o ms grandes que lo normal. A medida que envejecemos el estreimiento es ms comn. Una dieta baja en fibra, no tomar suficientes lquidos y el uso de ciertos medicamentos pueden Scientist, research (life sciences)empeorar el estreimiento.  CAUSAS   Ciertos medicamentos, como los antidepresivos, analgsicos, suplementos de hierro, anticidos y diurticos.  Algunas enfermedades, como la diabetes, el sndrome del colon irritable, enfermedad de la tiroides, o depresin.  No beber suficiente agua.  No consumir suficientes alimentos ricos en fibra.  Situaciones de estrs o viajes.  Falta de actividad fsica o de ejercicio.  Ignorar la necesidad sbita de Advertising copywriterdefecar.  Uso en exceso de laxantes. SIGNOS Y SNTOMAS   Defecar menos de tres veces por semana.  Dificultad para defecar.  Tener las heces secas y duras, o ms grandes que las normales.  Sensacin de estar lleno o hinchado.  Dolor en la parte baja del abdomen.  No sentir alivio despus de defecar. DIAGNSTICO  El mdico le har una historia clnica y un examen fsico. Pueden hacerle exmenes adicionales para el estreimiento grave. Estos estudios pueden ser:  Un radiografa con enema de bario para examinar el recto, el colon y, en algunos casos, el intestino delgado.  Una sigmoidoscopia para examinar el colon inferior.  Una colonoscopia para examinar todo el  colon. TRATAMIENTO  El tratamiento depender de la gravedad del estreimiento y de la causa. Algunos tratamientos nutricionales son beber ms lquidos y comer ms alimentos ricos en fibra. El cambio en el estilo de vida incluye hacer ejercicios de Fyffemanera regular. Si estas recomendaciones para Public relations account executiverealizar cambios en la dieta y en el estilo de vida no ayudan, el mdico le puede indicar el uso de laxantes de venta libre para ayudarlo a Advertising copywriterdefecar. Los medicamentos recetados se pueden prescribir si los medicamentos de venta libre no lo [place]ud[place].  INSTRUCCIONES PARA EL CUIDADO EN EL HOGAR   Consuma alimentos con alto contenido de Friedenswaldfibra, como frutas, vegetales, cereales integrales y porotos.  Limite los alimentos procesados ricos en grasas y azcar, como las papas fritas, hamburguesas, galletas, dulces y refrescos.  Puede agregar un suplemento de fibra a su dieta si no obtiene lo suficiente de los alimentos.  Beba suficiente lquido para Photographermantener la orina clara o de color amarillo plido.  Haga ejercicio regularmente o segn las indicaciones del mdico.  Vaya al bao cuando sienta la necesidad de ir. No se aguante las ganas.  Tome solo medicamentos de venta libre o recetados, segn las indicaciones del mdico. No tome otros medicamentos para el estreimiento sin consultarlo antes con su mdico. SOLICITE ATENCIN MDICA DE INMEDIATO SI:   Observa sangre brillante en las heces.  El estreimiento dura ms de 4 das o [place]empeora.  Siente dolor abdominal o rectal.  Las heces son delgadas como un lpiz.  Pierde peso de [place] inexplicable. ASEGRESE DE QUE:   Comprende estas instrucciones.  Controlar su afeccin.  Recibir ayuda de inmediato si no mejora o si empeora.   Esta informacin no tiene Theme park managercomo fin reemplazar el consejo del mdico. Asegrese de hacerle al mdico cualquier pregunta que tenga.   Document Released: 09/13/2007 Document Revised: 09/14/2014 Elsevier Interactive Patient Education  [employer]2016 Elsevier Inc.     IF you received an x-ray today, you will receive an invoice from [facility]. Please contact [facility] at [phone] with questions or concerns regarding your invoice.   IF you received labwork today, you will receive an invoice from [facility]. Please contact [facility] at [phone] with questions or concerns regarding your invoice.   Our billing staff will not be able to assist you with questions regarding bills from these companies.  You will be contacted with the lab results as soon as they are available. The fastest way to get your results is to activate your My Chart account. Instructions are located on the last page of this paperwork. If you have not heard from us regarding the results in 2 weeks, please contact this office.

## 2016-03-02 NOTE — Telephone Encounter (Signed)
Radiology report on CT scan received. Pt had decided not to wait for results but TL Renae called pt's cell to inform her - it appears she has right-sided pyelonephritis. No stone seen.  Possible cyst vs developing abscess in Rt kidney - new since last imaging 15 mos prior - but fortunately only measuring 10mm so will place pt on cipro 500 bid x 10d and have her recheck in office tomorrow. If any sign of persistent infection or pain, will need to repeat imaging to consider need for cyst/abscess drainage. May want to consider adding on ceftriaxone 1g IV vs IM x 3d as well if pt is not showing improvement on recheck tomorrow.

## 2016-03-02 NOTE — Progress Notes (Addendum)
MRN: [id number] DOB: September 08, 1975  Subjective:   Pamela LevyZenaida Cortes Travis is a 40 y.o. female presenting for Follow-up  Patient was initially seen on 02/28/2016 for flank and abdominal pain. She was suspected of having a renal stone, advised aggressive hydration, Percocet for pain. She felt better initially but has worsened over the weekend. Reports that she has had worsening flank pain, belly pain, nausea with vomiting, subjective fever, chills, decreased appetite. She has not passed a stone and has been straining her urine. She is hydrating very well, urinating still. No hematuria, dysuria. She has not had a bowel movement in 2 days.  Pamela SinkZenaida has a current medication list which includes the following prescription(s): citalopram, hydrocodone-acetaminophen, ibuprofen, and metronidazole. Also has No Known Allergies.  Pamela SinkZenaida  has a past medical history of Appendicitis and Depression. Also  has past surgical history that includes Cesarean section; Appendectomy (11/22/2014); and laparoscopic appendectomy (N/A, 11/22/2014).  Objective:   Vitals: BP 112/74 mmHg  Pulse 80  Temp(Src) 97.9 F (36.6 C) (Oral)  Resp 16  Ht 5\' 5"  (1.651 m)  Wt 174 lb 12.8 oz (79.289 kg)  BMI 29.09 kg/m2  SpO2 100%  LMP 02/17/2016  Physical Exam  Constitutional: She is oriented to person, place, and time. She appears well-developed and well-nourished.  HENT:  Mouth/Throat: Oropharynx is clear and moist.  Eyes: No scleral icterus.  Cardiovascular: Normal rate, regular rhythm and intact distal pulses.  Exam reveals no gallop and no friction rub.   No murmur heard. Pulmonary/Chest: No respiratory distress. She has no wheezes. She has no rales.  Abdominal: Soft. Bowel sounds are normal. She exhibits no distension and no mass. There is tenderness (bilateral CVA tenderness (R>L), epigastric and left-sided pain).  Neurological: She is alert and oriented to person, place, and time.  Skin: Skin is warm and dry.     Results for orders placed or performed in visit on 03/02/16 (from the past 24 hour(s))  POCT urine pregnancy     Status: None   Collection Time: 03/02/16 11:04 AM  Result Value Ref Range   Preg Test, Ur Negative Negative  POCT urinalysis dipstick     Status: Abnormal   Collection Time: 03/02/16 11:05 AM  Result Value Ref Range   Color, UA yellow yellow   Clarity, UA clear clear   Glucose, UA negative negative   Bilirubin, UA negative negative   Ketones, POC UA negative negative   Spec Grav, UA <=1.005    Blood, UA moderate (A) negative   pH, UA 7.0    Protein Ur, POC negative negative   Urobilinogen, UA 0.2    Nitrite, UA Negative Negative   Leukocytes, UA small (1+) (A) Negative  POCT Microscopic Urinalysis ([facility])     Status: Abnormal   Collection Time: 03/02/16 11:05 AM  Result Value Ref Range   WBC,UR,HPF,POC None None WBC/hpf   RBC,UR,HPF,POC Few (A) None RBC/hpf   Bacteria Few (A) None, Too numerous to count   Mucus Absent Absent   Epithelial Cells, UR Per Microscopy Moderate (A) None, Too numerous to count cells/hpf  POCT CBC     Status: Abnormal   Collection Time: 03/02/16 11:17 AM  Result Value Ref Range   WBC 8.7 4.6 - 10.2 K/uL   Lymph, poc 2.1 0.6 - 3.4   POC LYMPH PERCENT 24.7 10 - 50 %L   MID (cbc) 0.6 0 - 0.9   POC MID % 7.2 0 - 12 %M   POC Granulocyte 5.9 2 -  6.9   Granulocyte percent 68.1 37 - 80 %G   RBC 3.71 (A) 4.04 - 5.48 M/uL   Hemoglobin 11.5 (A) 12.2 - 16.2 g/dL   HCT, POC 40.931.7 (A) 81.137.7 - 47.9 %   MCV 85.4 80 - 97 fL   MCH, POC 31.1 27 - 31.2 pg   MCHC 36.4 (A) 31.8 - 35.4 g/dL   RDW, POC 91.412.2 %   Platelet Count, POC 338 142 - 424 K/uL   MPV 6.0 0 - 99.8 fL   Dg Abd 1 View  03/02/2016  CLINICAL DATA:  Flank pain for the past 5 days EXAM: ABDOMEN - 1 VIEW COMPARISON:  Abdominal and pelvic CT scan of November 21, 2014 FINDINGS: There is a moderately increased colonic stool burden bilaterally. There is no small or large bowel obstructive  pattern. No free extraluminal gas collections are observed. There is subtle increased density that projects over the lower pole of the right kidney which may reflect a stone. No ureteral or bladder stones are demonstrated. There is a phlebolith in the left aspect of the pelvis. The bony structures are unremarkable. IMPRESSION: Possible lower pole kidney stone on the right but radiodense contents within the stool could produce similar findings. Increased colonic stool burden diffusely which could reflect constipation in the appropriate clinical setting. Electronically Signed   By: David  Swazil[place]Jordan M.D.   On: 03/02/2016 11:26    Assessment and Plan :   This case was precepted with Dr. Trinna PostJeffery Greene.   1. Flank pain 2. Generalized abdominal pain 3. Abnormal urinalysis 4. Nausea and vomiting, intractability of vomiting not specified, unspecified vomiting type - Suspect renal stone as primary source of her pain, worsened by constipation. Counseled patient on differential. She is to get a stat CT today.  5. Constipation - Admits long standing history of constipation. Use Miralax and docusate for this.  Wallis BambergMario Mckinzie Saksa, PA-C [facility] [phone] 03/02/2016 10:45 AM

## 2016-03-03 ENCOUNTER — Ambulatory Visit (INDEPENDENT_AMBULATORY_CARE_PROVIDER_SITE_OTHER): Payer: Self-pay | Admitting: Urgent Care

## 2016-03-03 VITALS — BP 110/76 | HR 73 | Temp 98.1°F | Resp 18 | Ht 65.0 in | Wt 176.0 lb

## 2016-03-03 DIAGNOSIS — N12 Tubulo-interstitial nephritis, not specified as acute or chronic: Secondary | ICD-10-CM

## 2016-03-03 DIAGNOSIS — R319 Hematuria, unspecified: Secondary | ICD-10-CM

## 2016-03-03 DIAGNOSIS — R93429 Abnormal radiologic findings on diagnostic imaging of unspecified kidney: Secondary | ICD-10-CM

## 2016-03-03 LAB — URINE CULTURE

## 2016-03-03 MED ORDER — CEFTRIAXONE SODIUM 1 G IJ SOLR
1.0000 g | Freq: Once | INTRAMUSCULAR | Status: AC
  Administered 2016-03-03: 1 g via INTRAMUSCULAR

## 2016-03-03 NOTE — Telephone Encounter (Signed)
I called patient twice today to check on her and ask her to f/u today. I left a VM given that she did not answer.

## 2016-03-03 NOTE — Progress Notes (Signed)
MRN: [id number] DOB: [birth year]  Subjective:   Pamela Travis is a 40 y.o. female presenting for follow up on pyelonephritis.   Patient had abdominal CT 03/02/2016 which showed pyelonephritis and 1cm renal cyst versus abscess. She was subsequently placed on ciprofloxacin and advised to follow up today. She reports headache but admits that she is significantly better. Denies fever, belly pain, dysuria, flank pain. She went to work today but still felt pretty ill so she came back for follow up.  Pamela Travis has a current medication list which includes the following prescription(s): ciprofloxacin, citalopram, docusate sodium, hydrocodone-acetaminophen, ibuprofen, metronidazole, and polyethylene glycol powder. Also has No Known Allergies.  Pamela Travis  has a past medical history of Appendicitis and Depression. Also  has past surgical history that includes Cesarean section; Appendectomy (11/22/2014); and laparoscopic appendectomy (N/A, 11/22/2014).  Objective:   Vitals: BP 110/76 mmHg  Pulse 73  Temp(Src) 98.1 F (36.7 C) (Oral)  Resp 18  Ht 5\' 5"  (1.651 m)  Wt 176 lb (79.833 kg)  BMI 29.29 kg/m2  SpO2 100%  LMP 02/17/2016  Physical Exam  Constitutional: She is oriented to person, place, and time. She appears well-developed and well-nourished.  HENT:  Mouth/Throat: Oropharynx is clear and moist.  Cardiovascular: Normal rate, regular rhythm and intact distal pulses.  Exam reveals no gallop and no friction rub.   No murmur heard. Pulmonary/Chest: No respiratory distress. She has no wheezes. She has no rales.  Abdominal: Soft. Bowel sounds are normal. She exhibits no distension and no mass. There is no tenderness.  No CVA tenderness.  Musculoskeletal: She exhibits no edema.  Neurological: She is alert and oriented to person, place, and time.  Skin: Skin is warm and dry.   Dg Abd 1 View  03/02/2016  CLINICAL DATA:  Flank pain for the past 5 days EXAM: ABDOMEN - 1 VIEW COMPARISON:   Abdominal and pelvic CT scan of November 21, 2014 FINDINGS: There is a moderately increased colonic stool burden bilaterally. There is no small or large bowel obstructive pattern. No free extraluminal gas collections are observed. There is subtle increased density that projects over the lower pole of the right kidney which may reflect a stone. No ureteral or bladder stones are demonstrated. There is a phlebolith in the left aspect of the pelvis. The bony structures are unremarkable. IMPRESSION: Possible lower pole kidney stone on the right but radiodense contents within the stool could produce similar findings. Increased colonic stool burden diffusely which could reflect constipation in the appropriate clinical setting. Electronically Signed   By: David  [place]azilandJordan M.D.   On: 03/02/2016 11:26   Ct Abdomen Pelvis W Contrast  03/02/2016  CLINICAL DATA:  Right flank pain since February 28, 2016. Previous remote appendectomy. EXAM: CT ABDOMEN AND PELVIS WITH CONTRAST TECHNIQUE: Multidetector CT imaging of the abdomen and pelvis was performed using the standard protocol following bolus administration of intravenous contrast. CONTRAST:  100mL ISOVUE-300 IOPAMIDOL (ISOVUE-300) INJECTION 61% COMPARISON:  March 02, 2016 KUB and November 21, 2014 CT the abdomen and pelvis. FINDINGS: Evaluation is limited due to respiratory motion. The lung bases are unremarkable. No free air or free fluid. The left kidney is normal with no stones, masses, hydronephrosis, perinephric stranding, ureterectasis, or ureteral stones. There is mild stranding around the right kidney with slightly decreased enhancement of the right kidney versus the left. There is a 10 mm low-attenuation region in the upper pole of the right kidney on series 2, image 31 which was not seen  previously. A tiny low-attenuation region in the lower pole right kidney is stable, most likely a cyst but too small to characterize. Slight decreased attenuation is seen in the cortex of the  posterior right kidney on coronal image 96, also new. No right renal stones or hydronephrosis. No right ureterectasis. No right-sided ureteral stones. Several mildly prominent nodes are identified adjacent to the right ureter, stable or smaller in the interval, likely reactive in nature. The liver, gallbladder, spleen, adrenal glands, and pancreas are normal. The stomach and small bowel are unremarkable. The majority of the colon is normal. There is a several cm segment of proximal transverse colon on axial image 42. Evaluation of this segment of colon is mildly limited due to respiratory motion. However, there is no convincing evidence of adjacent fat stranding and this is thought to represent peristalsis rather than wall thickening. Remainder of the colon is normal. The patient is status post appendectomy. The abdominal aorta is normal. No other adenopathy identified. There is a fat containing umbilical hernia. The pelvis demonstrates no adenopathy or mass. The bladder is normal. Visualized bones are normal. IMPRESSION: 1. The findings associated with the right kidney are most consistent with developing pyelonephritis. Recommend correlation with urinalysis. The 10 mm rounded region of peripheral low-attenuation in the upper right kidney could represent a cyst which has developed in the 15 month interval. However, a developing abscess is not completely excluded. Recommend attention on short-term follow-up. These results will be called to the ordering clinician or representative by the Radiologist Assistant, and communication documented in the PACS or zVision Dashboard. Electronically Signed   By: Gerome Samavid  Williams III M.D   On: 03/02/2016 16:59    Assessment and Plan :   1. Pyelonephritis 2. Hematuria 3. Abnormal CT scan, kidney - Improved, IM ceftriaxone today, continue ciprofloxacin. Will refer to urology for further evaluation. - Ambulatory referral to Urology   Wallis BambergMario Luisdavid Hamblin, PA-C [facility] [phone] 03/03/2016 4:19 PM

## 2016-03-03 NOTE — Patient Instructions (Addendum)
Pielonefritis en los adultos (Pyelonephritis, Adult) La pielonefritis es una infeccin del rin. Los riones son los rganos que filtran la sangre y [employer] residuos del torrente sanguneo a travs de la orina. La orina pasa desde los riones, a travs de los urteres, Water engineer la vejiga. Hay dos tipos principales de pielonefritis:  Infecciones que se inician rpidamente sin sntomas previos (pielonefritis aguda).  Infecciones que persisten durante un perodo prolongado (pielonefritis crnica). En la [employer], la infeccin desaparece con el tratamiento y no causa otros problemas. Las infecciones ms graves o crnicas a veces pueden propagarse al torrente sanguneo u ocasionar otros problemas en los riones. CAUSAS Por lo general, entre las causas de esta afeccin, se incluyen las siguientes:  Bacterias que pasan desde la vejiga al rin a travs de la orina infectada. La orina de la vejiga puede infectarse por bacterias relacionadas con estas causas:  Infeccin en la vejiga (cistitis).  Inflamacin de la prstata (prostatitis).  Relaciones sexuales en las mujeres.  Bacterias que pasan del torrente sanguneo al rin. FACTORES DE RIESGO Es ms probable que esta afeccin se manifieste en:  Las embarazadas.  Las personas de edad avanzada.  Los diabticos.  Las personas que tienen clculos en los riones o la vejiga.  Las personas que tienen otras anomalas en el rin o los urteres.  Las personas que tienen una sonda vesical.  Las personas con Hotel manager.  Las personas que son sexualmente activas.  Las mujeres que usan espermicidas.  Las personas que han tenido una infeccin previa en las vas [place]. SNTOMAS Los sntomas de esta afeccin incluyen lo siguiente:  Ganas frecuentes de Garment/textile technologist.  Necesidad intensa o persistente de Garment/textile technologist.  Sensacin de ardor o escozor al [employer].  Dolor abdominal.  Dolor de espalda.  Dolor al costado del cuerpo o en la  fosa lumbar.  Cristy Hilts.  Escalofros.  Sangre en la [place].  Nuseas.  Vmitos. DIAGNSTICO Esta afeccin se puede diagnosticar en funcin de lo siguiente:  Examen fsico e historia clnica.  Anlisis de [place].  Anlisis de [place]. Tambin pueden [employer] de diagnstico por imgenes de los riones, por [place], una ecografa o una tomografa computarizada. TRATAMIENTO El tratamiento de esta afeccin puede depender de la gravedad de la infeccin.  Si la infeccin es leve y se detecta rpidamente, pueden administrarle antibiticos por va oral. Deber tomar lquido para permanecer hidratado.  Si la infeccin es ms grave, es posible que deban hospitalizarlo para administrarle antibiticos directamente en una vena a travs de una va intravenosa (IV). Quizs tambin deban administrarle lquidos a travs de una va intravenosa si no se encuentra bien hidratado. Despus de la hospitalizacin, es posible que deba tomar antibiticos durante un [place]. Podrn prescribirle otros tratamientos segn la causa de la infeccin. Carlos los medicamentos de venta libre y los recetados solamente como se lo haya indicado el mdico.  Si le recetaron un antibitico, tmelo como se lo haya indicado el mdico. No deje de tomar los antibiticos aunque comience a Sports administrator. Instrucciones generales  Beba suficiente lquido para mantener la orina clara o de color amarillo plido.  Evite la cafena, el t y las bebidas gaseosas. Estas sustancias irritan la vejiga.  Orine con frecuencia. Evite retener la orina durante largos perodos.  Orine antes y despus [place].  Despus de defecar, las mujeres deben higienizarse la regin perineal desde adelante hacia atrs. Use cada trozo de  papel higinico solo Lowe's Companiesuna vez.  Concurra a todas las visitas de control como se lo haya indicado el mdico. Esto es  importante. SOLICITE ATENCIN MDICA SI:  Los sntomas no mejoran despus de 2das de tratamiento.  Los sntomas empeoran.  Tiene fiebre. SOLICITE ATENCIN MDICA DE INMEDIATO SI:  No puede tomar los antibiticos ni ingerir lquidos.  Comienza a sentir escalofros.  Vomita.  Siente un dolor intenso en la espalda o en la fosa lumbar.  Se desmaya o siente una debilidad extrema.   Esta informacin no tiene Theme park managercomo fin reemplazar el consejo del mdico. Asegrese de hacerle al mdico cualquier pregunta que tenga.   Document Released: 06/03/2005 Document Revised: 05/15/2015 Elsevier Interactive Patient Education [employer]2016 Elsevier Inc.     IF you received an x-ray today, you will receive an invoice from [facility]. Please contact [facility] at [phone] with questions or concerns regarding your invoice.   IF you received labwork today, you will receive an invoice from [facility]. Please contact [facility] at [phone] with questions or concerns regarding your invoice.   Our billing staff will not be able to assist you with questions regarding bills from these companies.  You will be contacted with the lab results as soon as they are available. The fastest way to get your results is to activate your My Chart account. Instructions are located on the last page of this paperwork. If you have not heard from us regarding the results in 2 weeks, please contact this office.

## 2016-10-08 ENCOUNTER — Other Ambulatory Visit: Payer: Self-pay | Admitting: Urgent Care

## 2016-10-08 DIAGNOSIS — F329 Major depressive disorder, single episode, unspecified: Secondary | ICD-10-CM

## 2016-10-08 DIAGNOSIS — F32A Depression, unspecified: Secondary | ICD-10-CM

## 2017-01-20 ENCOUNTER — Encounter: Payer: Self-pay | Admitting: Gynecology

## 2017-07-13 IMAGING — CT CT ABD-PELV W/ CM
2 of 4 series · 16 of 46 positions shown, 18 images · IV contrast (iopamidol)
Comparison: March 02, 2016 KUB and November 21, 2014 CT the abdomen and
pelvis.

CLINICAL DATA: Right flank pain since February 28, 2016. Previous
remote appendectomy.

EXAM:
CT ABDOMEN AND PELVIS WITH CONTRAST
TECHNIQUE: Multidetector CT imaging of the abdomen and pelvis was performed
using the standard protocol following bolus administration of
intravenous contrast.
CONTRAST:  100mL [id number] IOPAMIDOL ([id number]) INJECTION 61%

[Series 2: rtn a/p with · axial · 0.71mm/px · z∈[-454,-28]mm · 13 of 96 slices shown, 15 images]
[im 6/96  soft-tissue]
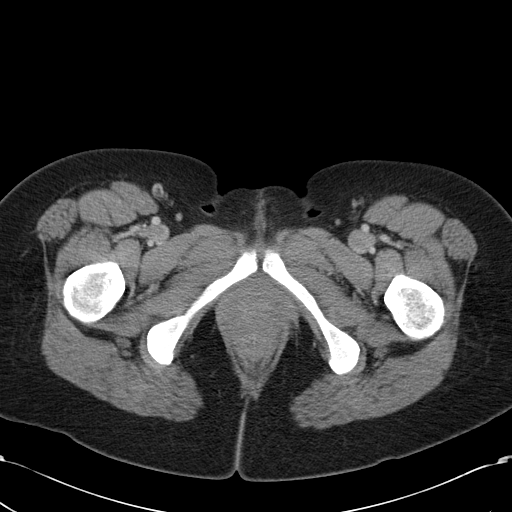
[im 6/96  bone]
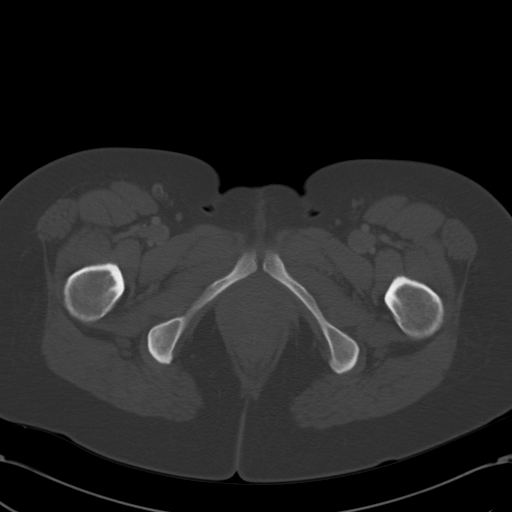
[im 16/96  soft-tissue]
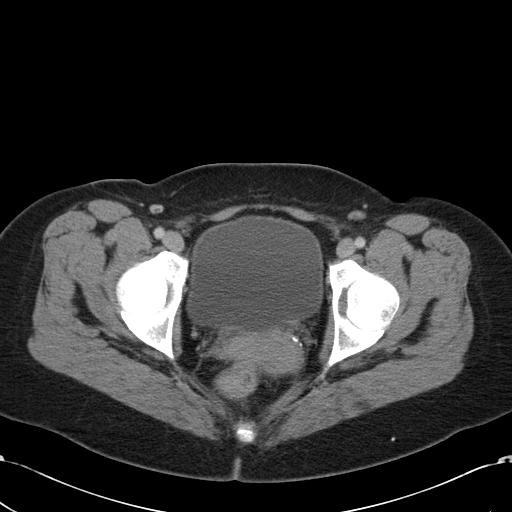
[im 21/96  soft-tissue]
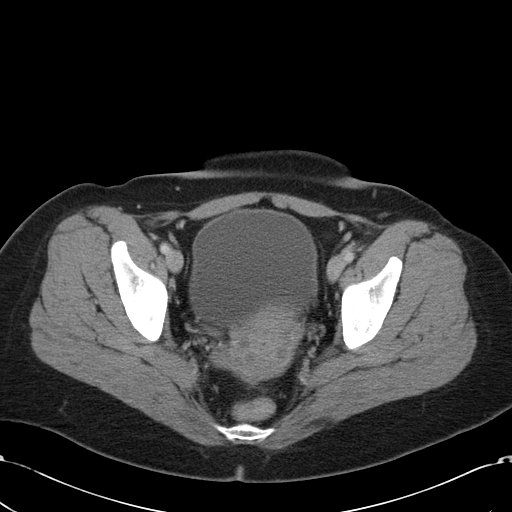
[im 26/96  soft-tissue]
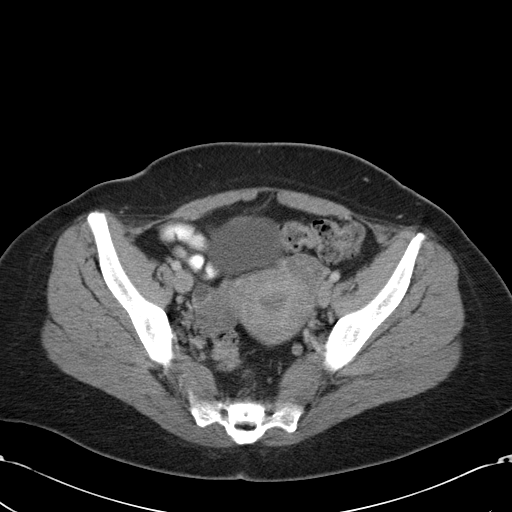
[im 36/96  soft-tissue]
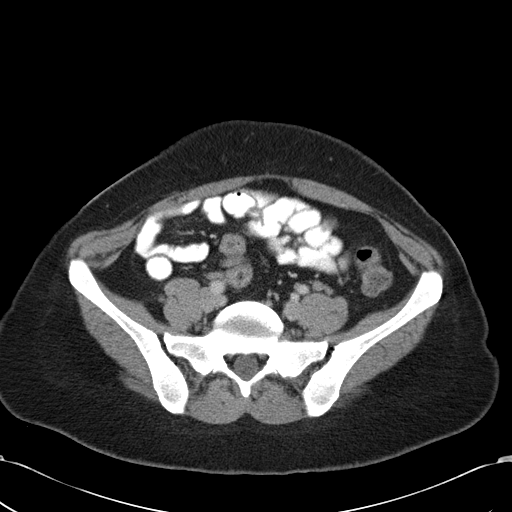
[im 41/96  soft-tissue]
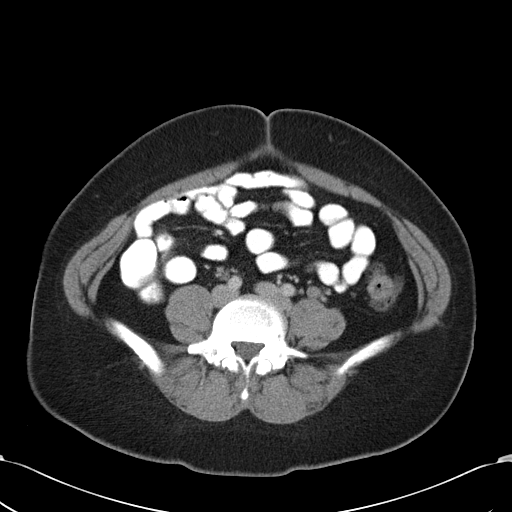
[im 51/96  soft-tissue]
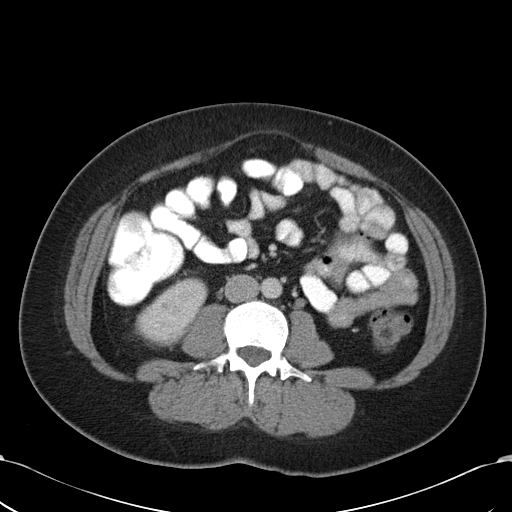
[im 56/96  soft-tissue]
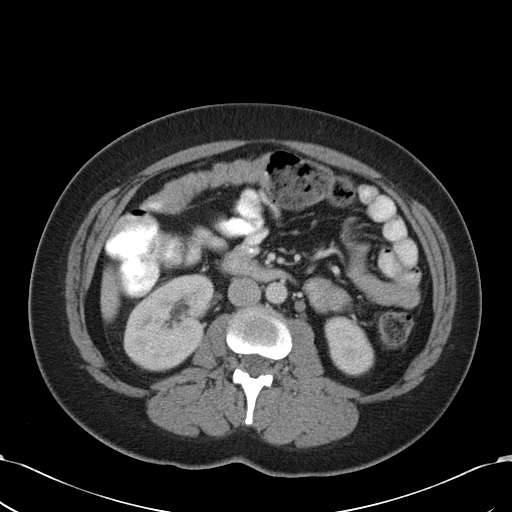
[im 61/96  soft-tissue]
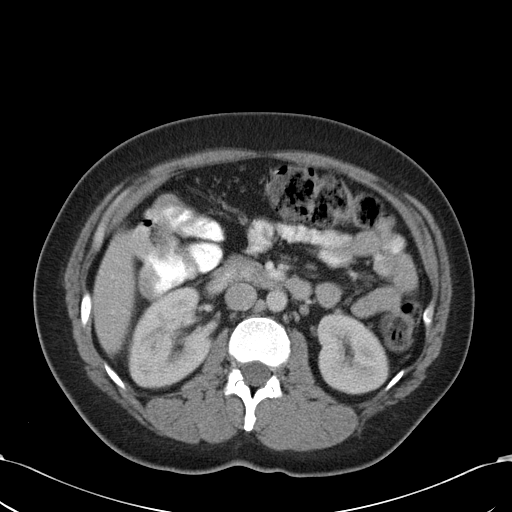
[im 61/96  bone]
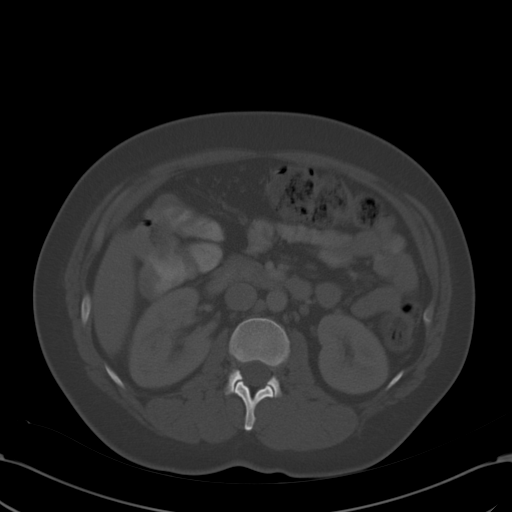
[im 71/96  soft-tissue]
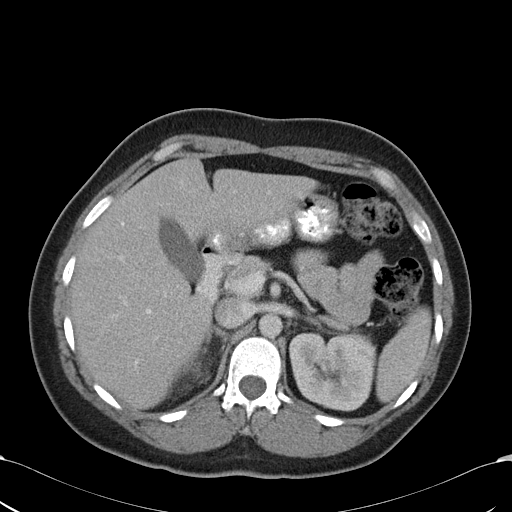
[im 76/96  soft-tissue]
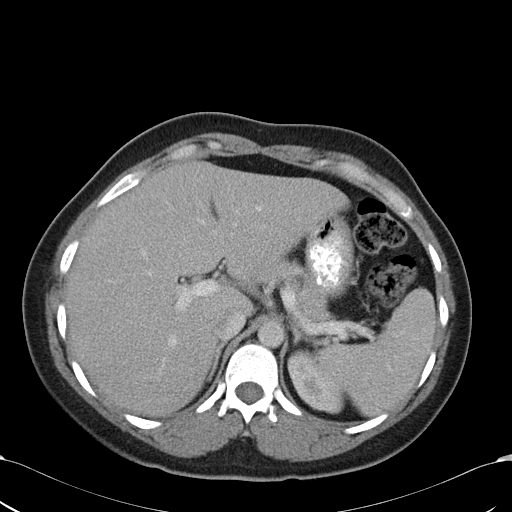
[im 81/96  soft-tissue]
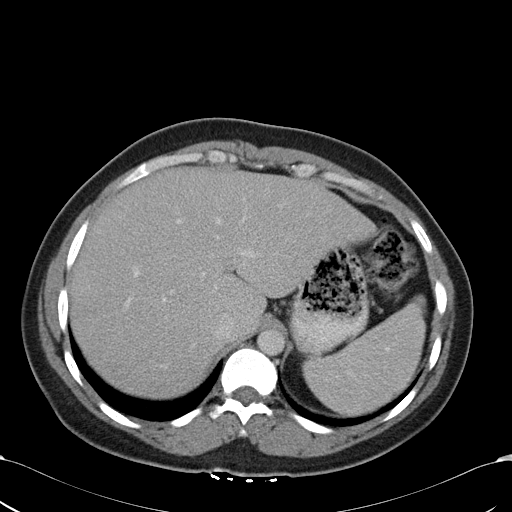
[im 91/96  soft-tissue]
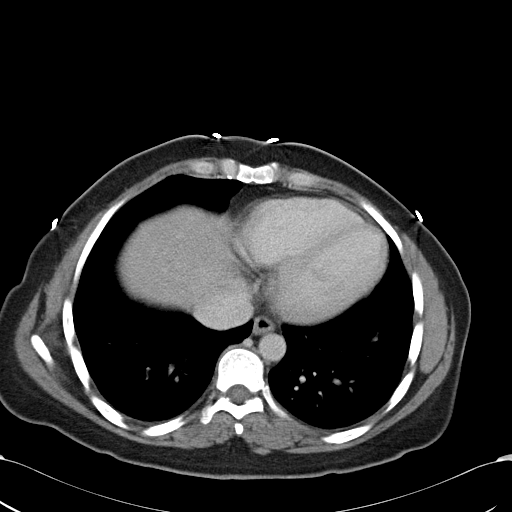

[Series 602: cor · coronal · 0.94mm/px · 3 of 123 slices shown]
[im 41/123  soft-tissue]
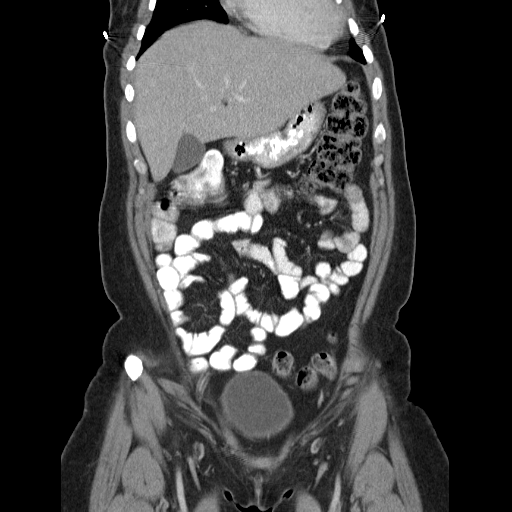
[im 55/123  soft-tissue]
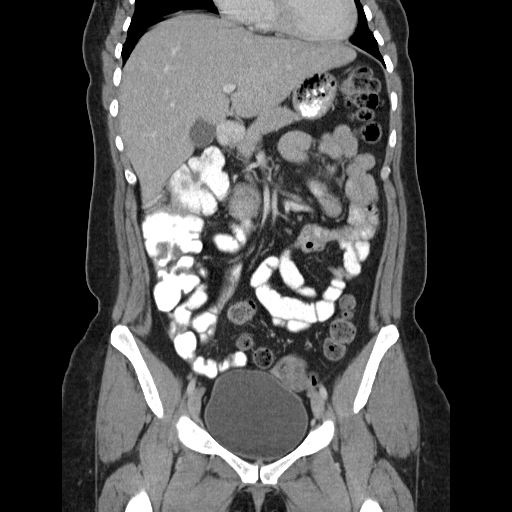
[im 68/123  soft-tissue]
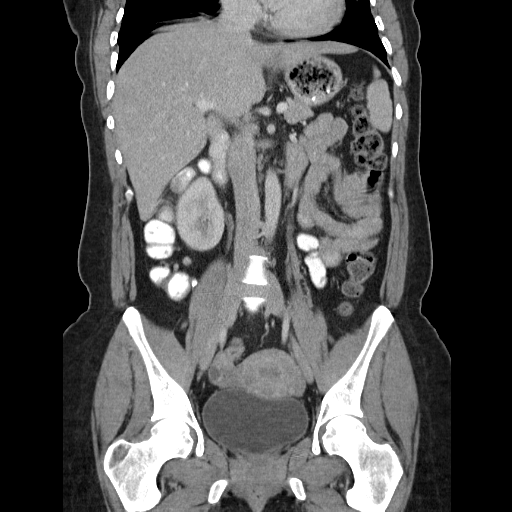

[16 of 46 positions shown; findings below may reference images not displayed]

FINDINGS: Evaluation is limited due to respiratory motion.

The lung bases are unremarkable.

No free air or free fluid.

The left kidney is normal with no stones, masses, hydronephrosis,
perinephric stranding, ureterectasis, or ureteral stones.

There is mild stranding around the right kidney with slightly
decreased enhancement of the right kidney versus the left. There is
a 10 mm low-attenuation region in the upper pole of the right kidney
on series 2, image 31 which was not seen previously. A tiny
low-attenuation region in the lower pole right kidney is stable,
most likely a cyst but too small to characterize. Slight decreased
attenuation is seen in the cortex of the posterior right kidney on
coronal image 96, also new. No right renal stones or hydronephrosis.
No right ureterectasis. No right-sided ureteral stones.

Several mildly prominent nodes are identified adjacent to the right
ureter, stable or smaller in the interval, likely reactive in
nature.

The liver, gallbladder, spleen, adrenal glands, and pancreas are
normal. The stomach and small bowel are unremarkable. The majority
of the colon is normal. There is a several cm segment of proximal
transverse colon on axial image 42. Evaluation of this segment of
colon is mildly limited due to respiratory motion. However, there is
no convincing evidence of adjacent fat stranding and this is thought
to represent peristalsis rather than wall thickening. Remainder of
the colon is normal. The patient is status post appendectomy. The
abdominal aorta is normal. No other adenopathy identified. There is
a fat containing umbilical hernia.

The pelvis demonstrates no adenopathy or mass. The bladder is
normal.

Visualized bones are normal.
IMPRESSION: 1. The findings associated with the right kidney are most consistent
with developing pyelonephritis. Recommend correlation with
urinalysis. The 10 mm rounded region of peripheral low-attenuation
in the upper right kidney could represent a cyst which has developed
in the 15 month interval. However, a developing abscess is not
completely excluded. Recommend attention on short-term follow-up.
These results will be called to the ordering clinician or
representative by the Radiologist Assistant, and communication
documented in the PACS or zVision Dashboard.

## 2017-12-10 ENCOUNTER — Encounter (HOSPITAL_COMMUNITY): Payer: Self-pay | Admitting: Emergency Medicine

## 2017-12-10 ENCOUNTER — Ambulatory Visit (HOSPITAL_COMMUNITY)
Admission: EM | Admit: 2017-12-10 | Discharge: 2017-12-10 | Disposition: A | Discharge status: Home / Self Care | Payer: Self-pay | Attending: Family Medicine | Admitting: Family Medicine

## 2017-12-10 DIAGNOSIS — M542 Cervicalgia: Secondary | ICD-10-CM

## 2017-12-10 DIAGNOSIS — F32A Depression, unspecified: Secondary | ICD-10-CM

## 2017-12-10 DIAGNOSIS — F329 Major depressive disorder, single episode, unspecified: Secondary | ICD-10-CM

## 2017-12-10 DIAGNOSIS — F320 Major depressive disorder, single episode, mild: Secondary | ICD-10-CM

## 2017-12-10 MED ORDER — CYCLOBENZAPRINE HCL 10 MG PO TABS: 10.0000 mg | ORAL_TABLET | Freq: Two times a day (BID) | ORAL | 0 refills | Status: DC | PRN

## 2017-12-10 MED ORDER — CITALOPRAM HYDROBROMIDE 20 MG PO TABS: 20.0000 mg | ORAL_TABLET | Freq: Every day | ORAL | 11 refills | Status: AC

## 2017-12-10 NOTE — ED Provider Notes (Signed)
[facility]    CSN: [id number] Arrival date & time: 12/10/17  1928     History   Chief Complaint Chief Complaint  Patient presents with  . Medication Refill  . Back Pain    HPI Pamela Travis is a 42 y.o. female.   42 yo female with depression presents for refill request for citalopram. She also has neck discomfort that she describes as tightness. Denies headache or suicidal ideation.     Past Medical History:  Diagnosis Date  . Appendicitis   . Depression     Patient Active Problem List   Diagnosis Date Noted  . Appendicitis 11/22/2014    Past Surgical History:  Procedure Laterality Date  . APPENDECTOMY  11/22/2014  . CESAREAN SECTION    . LAPAROSCOPIC APPENDECTOMY N/A 11/22/2014   Procedure: APPENDECTOMY LAPAROSCOPIC;  Surgeon: Abigail Miyamotoouglas Blackman, MD;  Location: [facility];  Service: General;  Laterality: N/A;    OB History   None      Home Medications    Prior to Admission medications   Medication Sig Start Date End Date Taking? Authorizing Provider  ciprofloxacin (CIPRO) 500 MG tablet Take 1 tablet (500 mg total) by mouth 2 (two) times daily. 03/02/16   Sherren MochaShaw, Eva N, MD  citalopram (CELEXA) 20 MG tablet TAKE A HALF A TABLET BY MOUTH FOR 2 WEEKS THEN INCREASE THIS DOSE TO 1 TABLET BY MOUTH FOR THE NEXT 2 WEEKS 10/10/16   Wallis BambergMani, Mario, PA-C  docusate sodium (COLACE) 50 MG capsule Take 1 capsule (50 mg total) by mouth 2 (two) times daily. 03/02/16   Wallis BambergMani, Mario, PA-C  HYDROcodone-acetaminophen (NORCO) 5-325 MG tablet Take 1-2 tablets by mouth every 6 (six) hours as needed. 02/28/16   Wallis BambergMani, Mario, PA-C  ibuprofen (ADVIL,MOTRIN) 200 MG tablet Take 200 mg by mouth every 6 (six) hours as needed.    [provider]  metroNIDAZOLE (FLAGYL) 500 MG tablet Take 1 tablet (500 mg total) by mouth 2 (two) times daily with a meal. DO NOT CONSUME ALCOHOL WHILE TAKING THIS MEDICATION. 02/28/16   Wallis BambergMani, Mario, PA-C  polyethylene glycol powder (GLYCOLAX/MIRALAX)  powder Take 17 g by mouth daily as needed. 03/02/16   Wallis BambergMani, Mario, PA-C    Family History History reviewed. No pertinent family history.  Social History Social History   Tobacco Use  . Smoking status: Never Smoker  . Smokeless tobacco: Never Used  Substance Use Topics  . Alcohol use: No  . Drug use: No     Allergies   Patient has no known allergies.   Review of Systems Review of Systems  Constitutional: Negative for activity change and appetite change.  HENT: Negative for congestion and ear pain.   Eyes: Negative for discharge and itching.  Respiratory: Negative for apnea and chest tightness.   Cardiovascular: Negative for chest pain and leg swelling.  Gastrointestinal: Negative for abdominal distention and abdominal pain.  Endocrine: Negative for cold intolerance and heat intolerance.  Genitourinary: Negative for difficulty urinating and dysuria.  Musculoskeletal: Positive for neck pain. Negative for arthralgias and back pain.  Neurological: Negative for dizziness and headaches.  Hematological: Negative for adenopathy.  Psychiatric/Behavioral: Negative for suicidal ideas.       Depression      Physical Exam Triage Vital Signs ED Triage Vitals [12/10/17 1938]  Enc Vitals Group     BP 126/83     Pulse Rate 72     Resp 18     Temp 98.1 F (36.7 C)  Temp Source Oral     SpO2 100 %     Weight      Height      Head Circumference      Peak Flow      Pain Score      Pain Loc      Pain Edu?      Excl. in GC?    No data found.  Updated Vital Signs BP 126/83 (BP Location: Right Arm)   Pulse 72   Temp 98.1 F (36.7 C) (Oral)   Resp 18   SpO2 100%   Visual Acuity Right Eye Distance:   Left Eye Distance:   Bilateral Distance:    Right Eye Near:   Left Eye Near:    Bilateral Near:     Physical Exam  Constitutional: She is oriented to person, place, and time. She appears well-developed and well-nourished.  HENT:  Head: Normocephalic and  atraumatic.  Eyes: Pupils are equal, round, and reactive to light. EOM are normal.  Neck: Normal range of motion. Neck supple.  Bilateral posterior tightness of muscles  Pulmonary/Chest: Effort normal.  Abdominal: Soft. She exhibits no distension.  Musculoskeletal: Normal range of motion. She exhibits no edema.  Neurological: She is alert and oriented to person, place, and time.  Skin: Skin is warm and dry.  Psychiatric: She has a normal mood and affect. Her behavior is normal.     UC Treatments / Results  Labs (all labs ordered are listed, but only abnormal results are displayed) Labs Reviewed - No data to display  EKG None Radiology No results found.  Procedures Procedures (including critical care time)  Medications Ordered in UC Medications - No data to display   Initial Impression / Assessment and Plan / UC Course  I have reviewed the triage vital signs and the nursing notes.  Pertinent labs & imaging results that were available during my care of the patient were reviewed by me and considered in my medical decision making (see chart for details).     1. Depression, chronic-refill citalopram 2. Neck pain, musculoskeletal, acute- treat with muscle relaxant  Final Clinical Impressions(s) / UC Diagnoses   Final diagnoses:  None    ED Discharge Orders    None       Controlled Substance Prescriptions [place] Controlled Substance Registry consulted? Not Applicable   Rolm Bookbinder, DO 12/10/17 1955

## 2017-12-10 NOTE — ED Triage Notes (Signed)
Pt sts was tapering self off citalopram and now havent had any x 2 weeks; pt sts crying spells and not feeling well; pt sts upper back pain

## 2018-01-25 ENCOUNTER — Ambulatory Visit (HOSPITAL_COMMUNITY)
Admission: EM | Admit: 2018-01-25 | Discharge: 2018-01-25 | Disposition: A | Discharge status: Home / Self Care | Payer: Self-pay | Attending: Family Medicine | Admitting: Family Medicine

## 2018-01-25 ENCOUNTER — Encounter (HOSPITAL_COMMUNITY): Payer: Self-pay | Admitting: Family Medicine

## 2018-01-25 DIAGNOSIS — Z3202 Encounter for pregnancy test, result negative: Secondary | ICD-10-CM

## 2018-01-25 DIAGNOSIS — R143 Flatulence: Secondary | ICD-10-CM | POA: Insufficient documentation

## 2018-01-25 DIAGNOSIS — N898 Other specified noninflammatory disorders of vagina: Secondary | ICD-10-CM

## 2018-01-25 DIAGNOSIS — R300 Dysuria: Secondary | ICD-10-CM | POA: Insufficient documentation

## 2018-01-25 LAB — POCT URINALYSIS DIP (DEVICE)
GLUCOSE, UA: NEGATIVE mg/dL
NITRITE: NEGATIVE
Protein, ur: >=300 mg/dL — AB
Specific Gravity, Urine: 1.025 (ref 1.005–1.030)
Urobilinogen, UA: 0.2 mg/dL (ref 0.0–1.0)
pH: 6 (ref 5.0–8.0)

## 2018-01-25 LAB — POCT PREGNANCY, URINE: PREG TEST UR: NEGATIVE

## 2018-01-25 MED ORDER — NITROFURANTOIN MONOHYD MACRO 100 MG PO CAPS: 100.0000 mg | ORAL_CAPSULE | Freq: Two times a day (BID) | ORAL | 0 refills | Status: AC

## 2018-01-25 NOTE — ED Triage Notes (Signed)
Pt here for foul smell in vaginal area and pain. sts some bleeding and discharge.

## 2018-01-25 NOTE — Discharge Instructions (Signed)
Urine showed evidence of infection. We are treating you with macrobid. Be sure to take full course. Stay hydrated- urine should be pale yellow to clear. My continue azo for relief of burning while infection is being cleared.   Please return or follow up with your primary provider if symptoms not improving with treatment. Please return sooner if you have worsening of symptoms or develop fever, nausea, vomiting, abdominal pain, back pain, lightheadedness, dizziness.  Please perform kegel exercises to help with vaginal flatulence

## 2018-01-26 LAB — CERVICOVAGINAL ANCILLARY ONLY
Bacterial vaginitis: NEGATIVE
CANDIDA VAGINITIS: NEGATIVE
CHLAMYDIA, DNA PROBE: NEGATIVE
Neisseria Gonorrhea: NEGATIVE
TRICH (WINDOWPATH): NEGATIVE

## 2018-01-26 NOTE — ED Provider Notes (Signed)
[facility]    CSN: [id number] Arrival date & time: 01/25/18  1914     History   Chief Complaint Chief Complaint  Patient presents with  . Vaginitis    HPI Pamela Travis is a 42 y.o. female  no contributing past medical history presenting today for evaluation of dysuria, odor, discharge and slight bleeding.  Also having lower back pain.  Last menstrual period was 1.5 weeks ago.  Not on any form of birth control.  Patient is also concerned as she has "noises" from her vagina as if she were farting.  HPI  Past Medical History:  Diagnosis Date  . Appendicitis   . Depression     Patient Active Problem List   Diagnosis Date Noted  . Appendicitis 11/22/2014    Past Surgical History:  Procedure Laterality Date  . APPENDECTOMY  11/22/2014  . CESAREAN SECTION    . LAPAROSCOPIC APPENDECTOMY N/A 11/22/2014   Procedure: APPENDECTOMY LAPAROSCOPIC;  Surgeon: Abigail Miyamoto, MD;  Location: [facility];  Service: General;  Laterality: N/A;    OB History   None      Home Medications    Prior to Admission medications   Medication Sig Start Date End Date Taking? Authorizing Provider  citalopram (CELEXA) 20 MG tablet Take 1 tablet (20 mg total) by mouth daily. 12/10/17   Moss, Amber, DO  ibuprofen (ADVIL,MOTRIN) 200 MG tablet Take 200 mg by mouth every 6 (six) hours as needed.    [provider]  nitrofurantoin, macrocrystal-monohydrate, (MACROBID) 100 MG capsule Take 1 capsule (100 mg total) by mouth 2 (two) times daily for 5 days. 01/25/18 01/30/18  Jaidyn Kuhl, Junius Creamer, PA-C    Family History History reviewed. No pertinent family history.  Social History Social History   Tobacco Use  . Smoking status: Never Smoker  . Smokeless tobacco: Never Used  Substance Use Topics  . Alcohol use: No  . Drug use: No     Allergies   Patient has no known allergies.   Review of Systems Review of Systems  Constitutional: Negative for fever.  Respiratory:  Negative for shortness of breath.   Cardiovascular: Negative for chest pain.  Gastrointestinal: Negative for abdominal pain, diarrhea, nausea and vomiting.  Genitourinary: Positive for dysuria, vaginal bleeding and vaginal discharge. Negative for flank pain, genital sores, hematuria, menstrual problem and vaginal pain.  Musculoskeletal: Negative for back pain.  Skin: Negative for rash.  Neurological: Negative for dizziness, light-headedness and headaches.     Physical Exam Triage Vital Signs ED Triage Vitals [01/25/18 1955]  Enc Vitals Group     BP 111/66     Pulse Rate 70     Resp 18     Temp 98.6 F (37 C)     Temp src      SpO2 100 %     Weight      Height      Head Circumference      Peak Flow      Pain Score      Pain Loc      Pain Edu?      Excl. in GC?    No data found.  Updated Vital Signs BP 111/66   Pulse 70   Temp 98.6 F (37 C)   Resp 18   LMP 01/13/2018   SpO2 100%   Visual Acuity Right Eye Distance:   Left Eye Distance:   Bilateral Distance:    Right Eye Near:   Left Eye Near:  Bilateral Near:     Physical Exam  Constitutional: She appears well-developed and well-nourished. No distress.  HENT:  Head: Normocephalic and atraumatic.  Eyes: Conjunctivae are normal.  Neck: Neck supple.  Cardiovascular: Normal rate and regular rhythm.  No murmur heard. Pulmonary/Chest: Effort normal and breath sounds normal. No respiratory distress.  Abdominal: Soft. There is no tenderness.  No CVA tenderness  Genitourinary:  Genitourinary Comments: Normal external female genitalia, no sources of bleeding visualized on external exam, minimal amount of discharge present in vaginal vault, no bleeding visualized.  Musculoskeletal: She exhibits no edema.  Neurological: She is alert.  Skin: Skin is warm and dry.  Psychiatric: She has a normal mood and affect.  Nursing note and vitals reviewed.    UC Treatments / Results  Labs (all labs ordered are listed,  but only abnormal results are displayed) Labs Reviewed  POCT URINALYSIS DIP (DEVICE) - Abnormal; Notable for the following components:      Result Value   Bilirubin Urine SMALL (*)    Ketones, ur TRACE (*)    Hgb urine dipstick LARGE (*)    Protein, ur >=300 (*)    Leukocytes, UA SMALL (*)    All other components within normal limits  URINE CULTURE  POCT PREGNANCY, URINE  CERVICOVAGINAL ANCILLARY ONLY    EKG None  Radiology No results found.  Procedures Procedures (including critical care time)  Medications Ordered in UC Medications - No data to display  Initial Impression / Assessment and Plan / UC Course  I have reviewed the triage vital signs and the nursing notes.  Pertinent labs & imaging results that were available during my care of the patient were reviewed by me and considered in my medical decision making (see chart for details).     UA showing small leukocytes. Will treat for UTI with Macrobid. Urine culture ordered, will call if need for any change in treatment. Patient without fever, nausea, vomiting, flank pain/CVA tenderness, unlikely pyelo.  Vaginal swab also obtained to check for yeast/BV/STDs.  Discussed trying [place] exercises to strengthen pelvic floor to prevent vaginal flatus.  Discussed strict return precautions. Patient verbalized understanding and is agreeable with plan.   Final Clinical Impressions(s) / UC Diagnoses   Final diagnoses:  Dysuria  Vaginal flatus     Discharge Instructions     Urine showed evidence of infection. We are treating you with macrobid. Be sure to take full course. Stay hydrated- urine should be pale yellow to clear. My continue azo for relief of burning while infection is being cleared.   Please return or follow up with your primary provider if symptoms not improving with treatment. Please return sooner if you have worsening of symptoms or develop fever, nausea, vomiting, abdominal pain, back pain, lightheadedness,  dizziness.  Please perform kegel exercises to help with vaginal flatulence    ED Prescriptions    Medication Sig Dispense Auth. Provider   nitrofurantoin, macrocrystal-monohydrate, (MACROBID) 100 MG capsule Take 1 capsule (100 mg total) by mouth 2 (two) times daily for 5 days. 10 capsule Harace Mccluney C, PA-C     Controlled Substance Prescriptions [place] Controlled Substance Registry consulted? Not Applicable   Lew Dawes, [place] 01/26/18 [phone]

## 2018-01-28 ENCOUNTER — Telehealth (HOSPITAL_COMMUNITY): Payer: Self-pay

## 2018-01-28 LAB — URINE CULTURE: Culture: >=100000 — AB

## 2018-01-28 NOTE — Telephone Encounter (Signed)
Urine culture positive for E.Coli, this was treated with macrobid at urgent care visit. Std screening negative. Attempted to contact patient. No answer.

## 2019-01-14 ENCOUNTER — Other Ambulatory Visit: Payer: Self-pay

## 2019-01-14 ENCOUNTER — Ambulatory Visit (HOSPITAL_COMMUNITY)
Admission: EM | Admit: 2019-01-14 | Discharge: 2019-01-14 | Disposition: A | Discharge status: Home / Self Care | Payer: Self-pay | Attending: Family Medicine | Admitting: Family Medicine

## 2019-01-14 DIAGNOSIS — F418 Other specified anxiety disorders: Secondary | ICD-10-CM

## 2019-01-14 MED ORDER — CITALOPRAM HYDROBROMIDE 20 MG PO TABS: 20.0000 mg | ORAL_TABLET | Freq: Every day | ORAL | 0 refills | Status: AC

## 2019-01-14 NOTE — Discharge Instructions (Signed)
Find a PCP.   Consider a heating pad.  Stretch.  Consider counseling.  Aim to do some physical exertion for 150 minutes per week. This is typically divided into 5 days per week, 30 minutes per day. The activity should be enough to get your heart rate up. Anything is better than nothing if you have time constraints.

## 2019-01-14 NOTE — ED Triage Notes (Signed)
Per pt she has been on medication and not sure if that is what is making her so tired and just has no energy. Pt said very fatigue. Pt says not having  annual exams. Pt has not been on mediations now for 4 days.

## 2019-01-14 NOTE — ED Provider Notes (Signed)
  [facility]    CSN: [id number] Arrival date & time: 01/14/19  1558     History    Chief Complaint  Patient presents with  . Medication Refill  . Fatigue    Subjective: Patient is a 43 y.o. female here for med refill.  Hx of dep/anxiety. Ran out of Celexa 20 mg/d 1 week ago. Anxiety/mood much worse since stopping. Also started having worsening fatigue, pain in shoulders and tingling in LUE. She does not have a PCP. Does not follow with counseling or psychology. No SI or HI. No self medication.   ROS: MSK: +shoulder pain Psych: As noted in HPI  Past med hx: anxiety w depression  Objective: BP 124/88 (BP Location: Right Arm)   Pulse 63   Temp 98 F (36.7 C) (Oral)   Resp 16   LMP 01/03/2019 (Approximate)   SpO2 98%  General: Awake, appears stated age HEENT: MMM, EOMi Heart: RRR, no murmurs Lungs: CTAB, no rales, wheezes or rhonchi. No accessory muscle use GI: BS+, S, NT, ND, no masses or organomegaly Psych: Age appropriate judgment and insight, flat affect and euthymic mood  Final Clinical Impressions(s) / UC Diagnoses   Final diagnoses:  Depression with anxiety   Refill for 3 mo. She requested more. Needs to see a PCP.    Discharge Instructions     Find a PCP.   Consider a heating pad.  Stretch.  Consider counseling.  Aim to do some physical exertion for 150 minutes per week. This is typically divided into 5 days per week, 30 minutes per day. The activity should be enough to get your heart rate up. Anything is better than nothing if you have time constraints.    ED Prescriptions    Medication Sig Dispense Auth. Provider   citalopram (CELEXA) 20 MG tablet Take 1 tablet (20 mg total) by mouth daily. 90 tablet Sharlene Dory, DO     Controlled Substance Prescriptions  Controlled Substance Registry consulted? Not Applicable   Sharlene Dory, [place] 01/14/19 1728

## 2020-02-13 ENCOUNTER — Encounter: Payer: Self-pay | Admitting: Emergency Medicine

## 2020-02-13 ENCOUNTER — Emergency Department
Admission: EM | Admit: 2020-02-13 | Discharge: 2020-02-13 | Disposition: A | Discharge status: Home / Self Care | Payer: Self-pay | Source: Home / Self Care | Attending: Family Medicine | Admitting: Family Medicine

## 2020-02-13 ENCOUNTER — Other Ambulatory Visit: Payer: Self-pay

## 2020-02-13 DIAGNOSIS — G43909 Migraine, unspecified, not intractable, without status migrainosus: Secondary | ICD-10-CM

## 2020-02-13 LAB — POCT FASTING CBG KUC MANUAL ENTRY: POCT Glucose (KUC): 143 mg/dL — AB (ref 70–99)

## 2020-02-13 MED ORDER — DEXAMETHASONE SODIUM PHOSPHATE 10 MG/ML IJ SOLN
10.0000 mg | Freq: Once | INTRAMUSCULAR | Status: AC
  Administered 2020-02-13: 10 mg via INTRAMUSCULAR

## 2020-02-13 MED ORDER — SUMATRIPTAN SUCCINATE 50 MG PO TABS: ORAL_TABLET | ORAL | 0 refills | Status: DC

## 2020-02-13 MED ORDER — METOCLOPRAMIDE HCL 5 MG/ML IJ SOLN
5.0000 mg | Freq: Once | INTRAMUSCULAR | Status: AC
  Administered 2020-02-13: 5 mg via INTRAMUSCULAR

## 2020-02-13 MED ORDER — KETOROLAC TROMETHAMINE 60 MG/2ML IM SOLN
60.0000 mg | Freq: Once | INTRAMUSCULAR | Status: AC
  Administered 2020-02-13: 60 mg via INTRAMUSCULAR

## 2020-02-13 MED ORDER — ONDANSETRON 4 MG PO TBDP
4.0000 mg | ORAL_TABLET | Freq: Once | ORAL | Status: AC
  Administered 2020-02-13: 4 mg via ORAL

## 2020-02-13 NOTE — ED Triage Notes (Signed)
Headache x 3 days OTC tylenol yesterday afternoon Not a normal migraine HA per pt  Has N/V- light sensitivity Pt does not have a PCP - usually goes to the ER for HA  Has a hx of hypoglycemia per pt CBG in triage (143) Pt has not eaten today Vomited clear in triage - given zofran COVID vaccine Pfizer (12/12/19)

## 2020-02-13 NOTE — ED Provider Notes (Signed)
[facility]    CSN: [id number] Arrival date & time: 02/13/20  0905      History   Chief Complaint Chief Complaint  Patient presents with   Headache   Emesis    HPI Pamela Travis is a 44 y.o. female.   Patient has a history of migraine headaches, although none recently.  Three days ago she developed a typical headache that has become more severe than usual, associated with photophobia and nausea.  She does not have a PCP, reporting that she usually goes to an ER for treatment, where she has never received medication for recurring headaches. She reports that she had COVID19 vaccination in April 2021.  The history is provided by the patient.  Migraine This is a recurrent problem. The current episode started more than 2 days ago. The problem occurs constantly. The problem has been gradually worsening. Associated symptoms include headaches. Exacerbated by: light. Nothing relieves the symptoms. She has tried nothing for the symptoms.    Past Medical History:  Diagnosis Date   Appendicitis    Depression     Patient Active Problem List   Diagnosis Date Noted   Appendicitis 11/22/2014    Past Surgical History:  Procedure Laterality Date   APPENDECTOMY  11/22/2014   CESAREAN SECTION     LAPAROSCOPIC APPENDECTOMY N/A 11/22/2014   Procedure: APPENDECTOMY LAPAROSCOPIC;  Surgeon: Abigail Miyamoto, MD;  Location: [facility];  Service: General;  Laterality: N/A;    OB History   No obstetric history on file.      Home Medications    Prior to Admission medications   Medication Sig Start Date End Date Taking? Authorizing Provider  ibuprofen (ADVIL,MOTRIN) 200 MG tablet Take 200 mg by mouth every 6 (six) hours as needed.   Yes [provider]  citalopram (CELEXA) 20 MG tablet Take 1 tablet (20 mg total) by mouth daily. 12/10/17   Moss, Amber, DO  citalopram (CELEXA) 20 MG tablet Take 1 tablet (20 mg total) by mouth daily. 01/14/19   Sharlene Dory, DO  penicillin v potassium (VEETID) 500 MG tablet Take 500 mg by mouth every 4 (four) hours. 01/03/20   [provider]  PREVIDENT 5000 SENSITIVE 1.1-5 % PSTE USE ONCE DAILY JUST BEFORE BEDTIME INSTEAD OF REGULAR TOOTHPASTE 10/25/19   [provider]  SUMAtriptan (IMITREX) 50 MG tablet Take one tab by mouth at first sign of headache.  May repeat once in two hours 02/13/20   Lattie Haw, MD    Family History Family History  Problem Relation Age of Onset   Hypertension Mother    Diabetes Mother    Hypertension Father    Diabetes Father    Healthy Sister    Diabetes Brother    Diabetes Brother    Diabetes Brother    Diabetes Brother     Social History Social History   Tobacco Use   Smoking status: Never Smoker   Smokeless tobacco: Never Used  Substance Use Topics   Alcohol use: No   Drug use: No     Allergies   Patient has no known allergies.   Review of Systems Review of Systems  Constitutional: Positive for activity change, appetite change and fatigue. Negative for chills, diaphoresis and fever.  HENT: Negative for congestion, ear pain, sinus pain and sore throat.   Eyes: Positive for photophobia. Negative for visual disturbance.  Respiratory: Negative.   Cardiovascular: Negative.   Gastrointestinal: Positive for nausea.  Genitourinary: Negative.  Musculoskeletal: Negative.   Skin: Negative.   Neurological: Positive for dizziness, light-headedness and headaches. Negative for tremors, seizures, syncope, facial asymmetry, speech difficulty, weakness and numbness.     Physical Exam Triage Vital Signs ED Triage Vitals  Enc Vitals Group     BP 02/13/20 0928 129/84     Pulse Rate 02/13/20 0928 75     Resp 02/13/20 0928 16     Temp 02/13/20 0953 98.4 F (36.9 C)     Temp Source 02/13/20 0928 Oral     SpO2 02/13/20 0928 99 %     Weight --      Height --      Head Circumference --      Peak Flow --      Pain Score  02/13/20 0928 10     Pain Loc --      Pain Edu? --      Excl. in [place]? --    No data found.  Updated Vital Signs BP 129/84 (BP Location: Right Arm)    Pulse 75    Temp 98.4 F (36.9 C) (Oral)    Resp 16    LMP 02/09/2020 (Exact Date)    SpO2 99%   Visual Acuity Right Eye Distance:   Left Eye Distance:   Bilateral Distance:    Right Eye Near:   Left Eye Near:    Bilateral Near:     Physical Exam Vitals and nursing note reviewed.  Constitutional:      General: She is not in acute distress.    Appearance: She is ill-appearing.  HENT:     Head: Normocephalic.     Nose: Nose normal.     Mouth/Throat:     Mouth: Mucous membranes are moist.  Eyes:     Extraocular Movements: Extraocular movements intact.     Conjunctiva/sclera: Conjunctivae normal.     Pupils: Pupils are equal, round, and reactive to light.  Cardiovascular:     Rate and Rhythm: Normal rate.     Heart sounds: Normal heart sounds.  Pulmonary:     Breath sounds: Normal breath sounds.  Abdominal:     Tenderness: There is no abdominal tenderness.  Musculoskeletal:     Cervical back: Neck supple.     Right lower leg: No edema.     Left lower leg: No edema.  Lymphadenopathy:     Cervical: No cervical adenopathy.  Skin:    General: Skin is warm and dry.  Neurological:     General: No focal deficit present.     Mental Status: She is alert and oriented to person, place, and time.     Cranial Nerves: No cranial nerve deficit.     Sensory: No sensory deficit.     Motor: No weakness.     Coordination: Coordination normal.      UC Treatments / Results  Labs (all labs ordered are listed, but only abnormal results are displayed) Labs Reviewed  POCT FASTING CBG [facility] MANUAL ENTRY - Abnormal; Notable for the following components:      Result Value   POCT Glucose ([facility]) 143 (*)    All other components within normal limits    EKG   Radiology No results found.  Procedures Procedures (including critical care  time)  Medications Ordered in UC Medications  ondansetron (ZOFRAN-ODT) disintegrating tablet 4 mg (4 mg Oral Given 02/13/20 0935)  ketorolac (TORADOL) injection 60 mg (60 mg Intramuscular Given 02/13/20 1208)  metoCLOPramide (REGLAN) injection 5 mg (5  mg Intramuscular Given 02/13/20 1209)  dexamethasone (DECADRON) injection 10 mg (10 mg Intramuscular Given 02/13/20 1209)    Initial Impression / Assessment and Plan / UC Course  I have reviewed the triage vital signs and the nursing notes.  Pertinent labs & imaging results that were available during my care of the patient were reviewed by me and considered in my medical decision making (see chart for details).    Administered Toradol 60mg  IM, Reglan 5mg  IM, and Decadron 10mg  IM Trial Rx of Imitrex for future headache. Followup with PCP for headache management.   Final Clinical Impressions(s) / UC Diagnoses   Final diagnoses:  Migraine without status migrainosus, not intractable, unspecified migraine type     Discharge Instructions     Rest.  Increase fluid intake.  If symptoms become significantly worse during the night or over the weekend, proceed to the local emergency room.     ED Prescriptions    Medication Sig Dispense Auth. Provider   SUMAtriptan (IMITREX) 50 MG tablet Take one tab by mouth at first sign of headache.  May repeat once in two hours 10 tablet  , MD        , MD 02/13/20 [phone]

## 2020-02-13 NOTE — Discharge Instructions (Signed)
Rest.  Increase fluid intake.  If symptoms become significantly worse during the night or over the weekend, proceed to the local emergency room.  

## 2020-08-09 ENCOUNTER — Other Ambulatory Visit: Payer: Self-pay | Admitting: Obstetrics and Gynecology

## 2020-08-09 DIAGNOSIS — Z1231 Encounter for screening mammogram for malignant neoplasm of breast: Secondary | ICD-10-CM

## 2020-10-10 ENCOUNTER — Ambulatory Visit
Admission: RE | Admit: 2020-10-10 | Discharge: 2020-10-10 | Disposition: A | Discharge status: Home / Self Care | Payer: Self-pay | Source: Ambulatory Visit | Attending: Obstetrics and Gynecology | Admitting: Obstetrics and Gynecology

## 2020-10-10 ENCOUNTER — Other Ambulatory Visit: Payer: Self-pay

## 2020-10-10 DIAGNOSIS — Z1231 Encounter for screening mammogram for malignant neoplasm of breast: Secondary | ICD-10-CM

## 2020-10-16 ENCOUNTER — Other Ambulatory Visit: Payer: Self-pay | Admitting: Obstetrics and Gynecology

## 2020-10-16 DIAGNOSIS — R928 Other abnormal and inconclusive findings on diagnostic imaging of breast: Secondary | ICD-10-CM

## 2020-11-05 ENCOUNTER — Ambulatory Visit
Admission: RE | Admit: 2020-11-05 | Discharge: 2020-11-05 | Disposition: A | Discharge status: Home / Self Care | Payer: No Typology Code available for payment source | Source: Ambulatory Visit | Attending: Obstetrics and Gynecology | Admitting: Obstetrics and Gynecology

## 2020-11-05 ENCOUNTER — Ambulatory Visit: Payer: Self-pay | Admitting: *Deleted

## 2020-11-05 ENCOUNTER — Other Ambulatory Visit: Payer: Self-pay

## 2020-11-05 VITALS — BP 110/84 | Wt 175.5 lb

## 2020-11-05 DIAGNOSIS — R928 Other abnormal and inconclusive findings on diagnostic imaging of breast: Secondary | ICD-10-CM

## 2020-11-05 DIAGNOSIS — Z1239 Encounter for other screening for malignant neoplasm of breast: Secondary | ICD-10-CM

## 2020-11-05 NOTE — Patient Instructions (Signed)
Explained breast self awareness with Tawni Levy. Patient did not need a Pap smear today due to last Pap smear was in November or December 2021 per patient. Let her know [facility] will cover Pap smears every 3 years unless has a history of abnormal Pap smears. Referred patient to the [facility] for a left breast diagnostic mammogram per recommendation. Appointment scheduled Tuesday, November 05, 2020 at 1240. Patient aware of appointment and will be there. Pamela Travis [name] verbalized understanding.  Rollie Hynek, Kathaleen Maser, RN 11:10 AM

## 2020-11-05 NOTE — Progress Notes (Signed)
[name] is a 45 y.o. female who presents to [facility] clinic today with complaint of bilateral nipple pain during menstrual period.  Patient referred to [facility] by the [facility] due to recommending additional imaging of the left breast. Screening mammogram completed 10/10/2020.    Pap Smear: Pap smear not completed today. Last Pap smear was in November or December 2021 at the [facility] clinic and was normal per patient. Per patient has history of an abnormal Pap smear 4-5 years ago that a repeat Pap smear was completed for follow-up that was normal. Patient stated she has had at least three normal Pap smears since abnormal. Last Pap smear result is not available in Epic.   Physical exam: Breasts Right breast slightly larger than left breast that per patient is normal for her. No skin abnormalities bilateral breasts. No nipple retraction bilateral breasts. No nipple discharge bilateral breasts. No lymphadenopathy. No lumps palpated bilateral breasts. No complaints of pain or tenderness on exam.       Pelvic/Bimanual Pap is not indicated today per [facility] guidelines.   Smoking History: Patient has never smoked.   Patient Navigation: Patient education provided. Access to services provided for patient through [facility] program. Spanish interpreter Natale Lay from [facility] provided.    Breast and Cervical Cancer Risk Assessment: Patient does not have family history of breast cancer, known genetic mutations, or radiation treatment to the chest before age 34. Patient does not have history of cervical dysplasia, immunocompromised, or DES exposure in-utero.  Risk Assessment    Risk Scores      11/05/2020   Last edited by: Meryl Dare, CMA   5-year risk: 0.5 %   Lifetime risk: 7.7 %         A: [facility] exam without pap smear Complaint of bilateral nipple pain during menstrual period.  P: Referred patient to the [facility] for a left breast diagnostic mammogram per recommendation. Appointment scheduled Tuesday, November 05, 2020 at 1240.  Priscille Heidelberg, RN 11/05/2020 11:10 AM

## 2023-06-06 ENCOUNTER — Ambulatory Visit (HOSPITAL_COMMUNITY)
Admission: EM | Admit: 2023-06-06 | Discharge: 2023-06-06 | Disposition: A | Discharge status: Home / Self Care | Payer: No Typology Code available for payment source

## 2023-06-06 ENCOUNTER — Encounter (HOSPITAL_COMMUNITY): Payer: Self-pay | Admitting: Emergency Medicine

## 2023-06-06 DIAGNOSIS — J00 Acute nasopharyngitis [common cold]: Secondary | ICD-10-CM

## 2023-06-06 MED ORDER — PROMETHAZINE-DM 6.25-15 MG/5ML PO SYRP: 5.0000 mL | ORAL_SOLUTION | Freq: Four times a day (QID) | ORAL | 0 refills | Status: AC | PRN

## 2023-06-06 MED ORDER — AZITHROMYCIN 250 MG PO TABS: ORAL_TABLET | ORAL | 0 refills | Status: AC

## 2023-06-06 NOTE — ED Provider Notes (Signed)
[facility]    CSN: [id number] Arrival date & time: 06/06/23  1016      History   Chief Complaint Chief Complaint  Patient presents with   Cough   Fever    HPI Pamela Travis is a 47 y.o. female with a history of depression presents to urgent care today with complaint of headache, sore throat, cough and intermittent shortness of breath.  She reports this started 7 days ago.  The headache is located in her forehead.  She describes the headache as pressure and feels like this is coming from coughing so much.  She denies difficulty swallowing.  The cough is mostly nonproductive.  She reports she is only short of breath when she has coughing spells.  She denies runny nose, nasal congestion, ear pain, chest pain, nausea, vomiting or diarrhea.  She denies fever, chills or bodyaches.  She has tried Mucinex and NyQuil OTC with minimal relief of symptoms.  She saw her PCP 2 days ago and they diagnosed her with a viral URI with cough.  She was given Jerilynn Som and advised to take Claritin and Tylenol OTC but she reports this has not helped at all.  She has not had any COVID, flu or strep testing.  HPI  Past Medical History:  Diagnosis Date   Appendicitis    Depression     Patient Active Problem List   Diagnosis Date Noted   Appendicitis 11/22/2014    Past Surgical History:  Procedure Laterality Date   APPENDECTOMY  11/22/2014   CESAREAN SECTION     LAPAROSCOPIC APPENDECTOMY N/A 11/22/2014   Procedure: APPENDECTOMY LAPAROSCOPIC;  Surgeon: Abigail Miyamoto, MD;  Location: [facility] OR;  Service: General;  Laterality: N/A;    OB History     Gravida  2   Para      Term      Preterm      AB      Living         SAB      IAB      Ectopic      Multiple      Live Births  2            Home Medications    Prior to Admission medications   Medication Sig Start Date End Date Taking? Authorizing Provider  acetaminophen (TYLENOL) 500 MG tablet Take  1,000 mg by mouth every 6 (six) hours as needed for fever, headache or moderate pain. 06/04/23  Yes [provider]  azithromycin (ZITHROMAX) 250 MG tablet Take 2 tabs today, then 1 tab daily x 4 days 06/06/23  Yes Astha Probasco, Salvadore Oxford, NP  benzonatate (TESSALON) 200 MG capsule Take 200 mg by mouth 3 (three) times daily as needed. 06/04/23  Yes [provider]  loratadine (CLARITIN) 10 MG tablet Take 10 mg by mouth daily. 06/04/23  Yes [provider]  promethazine-dextromethorphan (PROMETHAZINE-DM) 6.25-15 MG/5ML syrup Take 5 mLs by mouth 4 (four) times daily as needed for cough. 06/06/23  Yes Lorre Munroe, NP  citalopram (CELEXA) 20 MG tablet Take 1 tablet (20 mg total) by mouth daily. Patient not taking: Reported on 11/05/2020 12/10/17   Rolm Bookbinder, DO  citalopram (CELEXA) 20 MG tablet Take 1 tablet (20 mg total) by mouth daily. Patient not taking: Reported on 11/05/2020 01/14/19   Sharlene Dory, DO  ibuprofen (ADVIL,MOTRIN) 200 MG tablet Take 200 mg by mouth every 6 (six) hours as needed.    [provider]    Family History Family History  Problem Relation Age of Onset   Hypertension Mother    Diabetes Mother    Hypertension Father    Diabetes Father    Healthy Sister    Diabetes Brother    Diabetes Brother    Diabetes Brother    Diabetes Brother     Social History Social History   Tobacco Use   Smoking status: Never   Smokeless tobacco: Never  Vaping Use   Vaping status: Never Used  Substance Use Topics   Alcohol use: No   Drug use: No     Allergies   Patient has no known allergies.   Review of Systems Review of Systems  Constitutional:  Positive for fatigue. Negative for chills and fever.  HENT:  Positive for sore throat. Negative for congestion, ear pain, rhinorrhea, sinus pressure, sinus pain and trouble swallowing.   Respiratory:  Positive for cough and shortness of breath.   Cardiovascular:  Negative for chest pain.   Gastrointestinal:  Negative for diarrhea, nausea and vomiting.  Musculoskeletal:  Negative for arthralgias and myalgias.  Skin:  Negative for rash.  Neurological:  Positive for headaches. Negative for dizziness, weakness and light-headedness.     Physical Exam Triage Vital Signs ED Triage Vitals  Encounter Vitals Group     BP 06/06/23 1050 104/71     Systolic BP Percentile --      Diastolic BP Percentile --      Pulse Rate 06/06/23 1050 68     Resp 06/06/23 1050 17     Temp 06/06/23 1050 98.8 F (37.1 C)     Temp Source 06/06/23 1050 Oral     SpO2 06/06/23 1050 97 %     Weight --      Height --      Head Circumference --      Peak Flow --      Pain Score 06/06/23 1048 6     Pain Loc --      Pain Education --      Exclude from Growth Chart --    No data found.  Updated Vital Signs BP 104/71 (BP Location: Right Arm)   Pulse 68   Temp 98.8 F (37.1 C) (Oral)   Resp 17   LMP 05/21/2023   SpO2 97%    Physical Exam Constitutional:      General: She is not in acute distress.    Comments: Appears unwell  HENT:     Head: Normocephalic.     Comments: No sinus tenderness noted    Right Ear: Tympanic membrane, ear canal and external ear normal.     Left Ear: Tympanic membrane, ear canal and external ear normal.     Nose: Congestion present.     Mouth/Throat:     Mouth: Mucous membranes are moist.     Pharynx: No oropharyngeal exudate or posterior oropharyngeal erythema.  Eyes:     Extraocular Movements: Extraocular movements intact.     Conjunctiva/sclera: Conjunctivae normal.     Pupils: Pupils are equal, round, and reactive to light.  Cardiovascular:     Rate and Rhythm: Normal rate and regular rhythm.     Heart sounds: Normal heart sounds.  Pulmonary:     Effort: Pulmonary effort is normal.     Breath sounds: Normal breath sounds. No wheezing, rhonchi or rales.  Lymphadenopathy:     Cervical: No cervical adenopathy.  Skin:    General: Skin is warm  and dry.      Findings: No rash.  Neurological:     General: No focal deficit present.     Mental Status: She is alert and oriented to person, place, and time.      UC Treatments / Results  Labs   EKG   Radiology No results found.  Procedures Procedures (including critical care time)  Medications Ordered in UC Medications - No data to display  Initial Impression / Assessment and Plan / UC Course  I have reviewed the triage vital signs and the nursing notes.  Pertinent labs & imaging results that were available during my care of the patient were reviewed by me and considered in my medical decision making (see chart for details).     47 year old female with 1 week history of headache, sore throat, cough and shortness of breath previously diagnosed with a viral URI with cough by her PCP.  DDx include viral sinusitis, bacterial sinusitis, viral pharyngitis, bacterial pharyngitis, upper respiratory infection, viral bronchitis, bacterial pneumonia, COVID, influenza.  Given that she is 7 days into her symptoms, she is too far out for COVID or flu testing at this time.  Her exam is not consistent with any type of pharyngitis so we will hold off on strep testing at this time.  Lung exam is clear so we will hold off on chest x-ray at this time.  Exam most consistent with an upper respiratory infection.  Encouraged rest and fluids.  Will treat with azithromycin x 5 days.  Will also prescribe Promethazine DM cough syrup to help suppress her cough.  Okay to continue Claritin and Tylenol OTC as previously advised.  Advised her to follow-up with her PCP if symptoms persist or worsen.  Final Clinical Impressions(s) / UC Diagnoses   Final diagnoses:  Acute nasopharyngitis     Discharge Instructions      You were seen today for respiratory symptoms.  There is no reason to test you for COVID or flu as you are outside the window for antiviral treatment.  We are diagnosing you with an upper respiratory  infection.  Given the duration of your symptoms, we will treat you with antibiotics x 5 days and cough syrup.  Please be aware that the cough syrup may cause sedation.  You can continue Claritin and Tylenol as previously recommended.  We encourage rest and fluids.  Please follow-up with your PCP if symptoms persist or worsen.     ED Prescriptions     Medication Sig Dispense Auth. Provider   azithromycin (ZITHROMAX) 250 MG tablet Take 2 tabs today, then 1 tab daily x 4 days 6 tablet Lorre Munroe, NP   promethazine-dextromethorphan (PROMETHAZINE-DM) 6.25-15 MG/5ML syrup Take 5 mLs by mouth 4 (four) times daily as needed for cough. 118 mL Lorre Munroe, NP      PDMP not reviewed this encounter.   Lorre Munroe, NP 06/06/23 1120

## 2023-06-06 NOTE — ED Triage Notes (Addendum)
Pt reports starting feeling bad Monday. Reports went to PCP Friday and given Tessalon, Claritin and Tylenol but states not helping. Having cough, head and sinus pain, fevers, and congestion.

## 2023-06-06 NOTE — Discharge Instructions (Signed)
You were seen today for respiratory symptoms.  There is no reason to test you for COVID or flu as you are outside the window for antiviral treatment.  We are diagnosing you with an upper respiratory infection.  Given the duration of your symptoms, we will treat you with antibiotics x 5 days and cough syrup.  Please be aware that the cough syrup may cause sedation.  You can continue Claritin and Tylenol as previously recommended.  We encourage rest and fluids.  Please follow-up with your PCP if symptoms persist or worsen.

## 2023-07-13 ENCOUNTER — Other Ambulatory Visit: Payer: Self-pay | Admitting: Obstetrics & Gynecology

## 2023-07-13 DIAGNOSIS — Z1231 Encounter for screening mammogram for malignant neoplasm of breast: Secondary | ICD-10-CM

## 2023-07-15 ENCOUNTER — Ambulatory Visit
Admission: RE | Admit: 2023-07-15 | Discharge: 2023-07-15 | Disposition: A | Discharge status: Home / Self Care | Payer: No Typology Code available for payment source | Source: Ambulatory Visit | Attending: Obstetrics & Gynecology | Admitting: Obstetrics & Gynecology

## 2023-07-15 DIAGNOSIS — Z1231 Encounter for screening mammogram for malignant neoplasm of breast: Secondary | ICD-10-CM
